# Patient Record
Sex: Male | Born: 1940 | Race: White | Hispanic: No | Marital: Married | Smoking: Former smoker
Health system: Southern US, Community
[De-identification: ages and names within clinical notes are randomized; demographics above are authoritative.]

## PROBLEM LIST (undated history)

## (undated) DIAGNOSIS — G720 Drug-induced myopathy: Secondary | ICD-10-CM

## (undated) DIAGNOSIS — H269 Unspecified cataract: Secondary | ICD-10-CM

## (undated) DIAGNOSIS — M1990 Unspecified osteoarthritis, unspecified site: Secondary | ICD-10-CM

## (undated) DIAGNOSIS — I10 Essential (primary) hypertension: Secondary | ICD-10-CM

## (undated) DIAGNOSIS — L03119 Cellulitis of unspecified part of limb: Secondary | ICD-10-CM

## (undated) DIAGNOSIS — N200 Calculus of kidney: Secondary | ICD-10-CM

## (undated) DIAGNOSIS — J189 Pneumonia, unspecified organism: Secondary | ICD-10-CM

## (undated) DIAGNOSIS — T7840XA Allergy, unspecified, initial encounter: Secondary | ICD-10-CM

## (undated) DIAGNOSIS — J302 Other seasonal allergic rhinitis: Secondary | ICD-10-CM

## (undated) DIAGNOSIS — E785 Hyperlipidemia, unspecified: Secondary | ICD-10-CM

## (undated) DIAGNOSIS — T466X5A Adverse effect of antihyperlipidemic and antiarteriosclerotic drugs, initial encounter: Secondary | ICD-10-CM

## (undated) DIAGNOSIS — B029 Zoster without complications: Secondary | ICD-10-CM

## (undated) HISTORY — DX: Unspecified cataract: H26.9

## (undated) HISTORY — DX: Calculus of kidney: N20.0

## (undated) HISTORY — DX: Allergy, unspecified, initial encounter: T78.40XA

## (undated) HISTORY — DX: Zoster without complications: B02.9

## (undated) HISTORY — DX: Hyperlipidemia, unspecified: E78.5

## (undated) HISTORY — DX: Essential (primary) hypertension: I10

## (undated) HISTORY — DX: Adverse effect of antihyperlipidemic and antiarteriosclerotic drugs, initial encounter: T46.6X5A

## (undated) HISTORY — DX: Drug-induced myopathy: G72.0

## (undated) HISTORY — DX: Pneumonia, unspecified organism: J18.9

---
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
## 2005-12-05 ENCOUNTER — Ambulatory Visit: Payer: Self-pay | Admitting: Cardiology

## 2005-12-11 ENCOUNTER — Ambulatory Visit: Payer: Self-pay

## 2008-05-26 HISTORY — PX: LITHOTRIPSY: SUR834

## 2008-05-26 HISTORY — PX: UMBILICAL HERNIA REPAIR: SUR1181

## 2009-12-31 ENCOUNTER — Emergency Department (HOSPITAL_COMMUNITY): Admission: EM | Admit: 2009-12-31 | Discharge: 2009-12-31 | Payer: Self-pay | Admitting: Emergency Medicine

## 2010-01-17 ENCOUNTER — Ambulatory Visit (HOSPITAL_COMMUNITY): Admission: RE | Admit: 2010-01-17 | Discharge: 2010-01-17 | Payer: Self-pay | Admitting: Urology

## 2010-04-09 ENCOUNTER — Encounter: Admission: RE | Admit: 2010-04-09 | Discharge: 2010-04-09 | Payer: Self-pay | Admitting: Surgery

## 2010-04-11 ENCOUNTER — Ambulatory Visit (HOSPITAL_BASED_OUTPATIENT_CLINIC_OR_DEPARTMENT_OTHER): Admission: RE | Admit: 2010-04-11 | Discharge: 2010-04-11 | Payer: Self-pay | Admitting: Surgery

## 2010-08-06 LAB — BASIC METABOLIC PANEL
GFR calc Af Amer: >60 mL/min (ref >60)
GFR calc non Af Amer: 56 mL/min — ABNORMAL LOW (ref >60)
Glucose, Bld: 96 mg/dL (ref 70–99)
Potassium: 4.8 mEq/L (ref 3.5–5.1)
Sodium: 140 mEq/L (ref 135–145)

## 2010-08-09 LAB — BASIC METABOLIC PANEL
GFR calc Af Amer: 39 mL/min — ABNORMAL LOW (ref >60)
GFR calc non Af Amer: 32 mL/min — ABNORMAL LOW (ref >60)
Glucose, Bld: 91 mg/dL (ref 70–99)
Potassium: 4.2 mEq/L (ref 3.5–5.1)
Sodium: 138 mEq/L (ref 135–145)

## 2010-08-09 LAB — CBC
HCT: 34.7 % — ABNORMAL LOW (ref 39.0–52.0)
Hemoglobin: 12 g/dL — ABNORMAL LOW (ref 13.0–17.0)
MCH: 31.4 pg (ref 26.0–34.0)
MCHC: 34.5 g/dL (ref 30.0–36.0)
MCV: 91.2 fL (ref 78.0–100.0)
Platelets: 298 K/uL (ref 150–400)
RBC: 3.81 MIL/uL — ABNORMAL LOW (ref 4.22–5.81)
RDW: 12.9 % (ref 11.5–15.5)
WBC: 7.7 K/uL (ref 4.0–10.5)

## 2010-08-09 LAB — URINALYSIS, ROUTINE W REFLEX MICROSCOPIC
Glucose, UA: NEGATIVE mg/dL
Leukocytes, UA: NEGATIVE
Specific Gravity, Urine: 1.028 (ref 1.005–1.030)
pH: 5.5 (ref 5.0–8.0)

## 2010-08-09 LAB — URINE MICROSCOPIC-ADD ON

## 2010-10-11 NOTE — Assessment & Plan Note (Signed)
[facility]                              CARDIOLOGY OFFICE NOTE   RUHAN, BORAK                    MRN:          [id number]  DATE:12/05/2005                            DOB:          [birth year]    PRIMARY CARE PHYSICIAN:  Larina Earthly, M.D.   REASON FOR PRESENTATION:  The patient had an abnormal EKG.   HISTORY OF PRESENT ILLNESS:  The patient is a very pleasant 70 year old  gentleman without prior cardiac history.  He has been managed by Dr. Felipa Eth  for hypertension, dyslipidemia.  He was recently noted to have an abnormal  EKG with T wave inversions in I, aVL, V5, and V6.  This was not noted  previously.  The patient reports no cardiac symptoms.  He does walk on a  treadmill.  He had not done that a lot recently though he did it yesterday.  He says when he does he gets his heart rate up, though he does not track it.  He works hard enough to be short of breath and sweat.  With this, he denies  any chest pressure, neck discomfort , arm discomfort, activity-induced  nausea, vomiting, excessive diaphoresis.  He does not have any palpitations,  presyncope, or syncope.  He denies any PND, or orthopnea.   PAST MEDICAL HISTORY:  1.  Hypertension times 2-3 years.  2.  Hyperlipidemia.   PAST SURGICAL HISTORY:  None.   ALLERGIES:  NONE.   MEDICATIONS:  1.  Lisinopril 20 mg every day.  2.  Coreg 40 mg every day.  3.  Allegra.   SOCIAL HISTORY:  The patient is a retired Optician, dispensing.  He has 2 children and  his grandchildren.  He quit smoking 30 years ago.  He does not drink  alcohol.   FAMILY HISTORY:  Noncontributory for early coronary artery disease.  His  father died in his 9s of a stroke.   REVIEW OF SYSTEMS:  As stated in the HPI.  Positive for snoring, hearing  loss, occasional knee pain, occasional leg cramps.  Negative for other  systems.   PHYSICAL EXAMINATION:  GENERAL:  The patient is in no distress.  VITAL SIGNS:  Weight 297  pounds, body mass index 37, blood pressure 155/82,  heart rate 65 and regular.  HEENT:  Eyes:  Unremarkable.  Pupils are equal, round, and reactive to  light.  Fundi within normal limits.  Oral mucosa unremarkable.  NECK:  No jugular venous distention at 45 degrees.  Carotid upstroke brisk  and symmetric.  No bruits.  No thyromegaly.  LYMPHATICS:  No cervical, axillary, or inguinal adenopathy.  LUNGS:  Clear to auscultation bilaterally.  BACK:  No costovertebral angle tenderness.  CHEST:  Unremarkable.  HEART:  PMI not displaced or sustained.  S1 and S2 within normal limits.  No  S3.  No S4.  No murmurs.  ABDOMEN:  Obese.  Midline umbilical hernia.  Positive bowel sounds, normal  in frequency and pitch.  No bruits.  No rebound.  No guarding.  No midline  pulsatile mass.  No hepatomegaly.  No splenomegaly.  SKIN:  No rashes.  No nodules.  EXTREMITIES:  With 2+ pulses throughout.  No edema.  No cyanosis.  No  clubbing.  NEUROLOGIC:  Oriented to person, place, and time.  Cranial nerves II-XII  grossly intact.  Motor grossly intact.   EKG:  Sinus rhythm, rate 65, axis within normal limits, intervals within  normal limits, high lateral T wave inversions 1 and aVL.  No acute ST-T wave  changes.   ASSESSMENT/PLAN:  1.  Abnormal electrocardiogram.  The patient does have an abnormal      electrocardiogram.  The T wave inversion seen previously in V5 and V6      are not present today.  This most likely is related to hypertension,      though he is at moderate risk for obstructive coronary disease.      Therefore, he is going to get a stress perfusion study.  We will also      aggressively pursue primary risk reduction.  2.  Hypertension.  Blood pressure is difficult to control.  I have taken the      liberty of changing him to Lisinopril HCT 20/12.5.  I have advised him      to get a blood pressure cuff and keep a diary for Dr. Felipa Eth.  3.  Of note, the patient has symptoms consistent with  sleep apnea with      daytime somnolence and snoring.  I will defer to Dr. Felipa Eth but would      suggest a sleep study.  I have discussed this diagnosis at length with      the patient including the treatment of CPAP and weight loss.  He will      follow up with Dr. Felipa Eth to pursue this.  he should increase his citrus      fruits and have a B-MET when he sees Dr. Felipa Eth in August.  It is unlikely      that he will get hypokalemia with this low dose of diuretic and with      dietary supplement.  4.  Obesity.  We spent quite a bit of time discussing this.  He has a body      mass index that puts him in the morbid obesity range.  He says he does      follow a healthy lifestyle changes and seems to eat foods on a [facility] Diet; therefore, I think he might best be served by calorie-      counting with a Weight Watchers approach.  5.  Dyslipidemia.  He had improvement in his lipid profile recently and this      is followed by Dr. Felipa Eth.   FOLLOWUP:  Will be back in this clinic as needed.  If he has an abnormal  stress perfusion study, I will follow up accordingly.  Otherwise, he will  keep his appointment with Dr. Felipa Eth for followup of his blood pressure and  primary risk reduction.                                   Rollene Rotunda, MD, [facility]   JH/MedQ  DD:  12/05/2005  DT:  12/05/2005  Job #:  [id number]   cc:   Larina Earthly, MD

## 2011-04-16 ENCOUNTER — Other Ambulatory Visit: Payer: Self-pay | Admitting: Orthopedic Surgery

## 2011-04-16 ENCOUNTER — Encounter: Payer: Self-pay | Admitting: Orthopedic Surgery

## 2011-04-16 ENCOUNTER — Encounter (HOSPITAL_COMMUNITY): Payer: Self-pay | Admitting: Pharmacy Technician

## 2011-04-16 NOTE — H&P (Signed)
Shawn Keller  DOB: 12/02/1940  Date of Admission:  04/28/2011  Chief Complaint:  Left Knee Pain  History of Present Illness The patient is a 70 year old male who comes in today for a preoperative History and Physical. The patient is scheduled for a left total knee arthroplasty to be performed by Dr. Frank V. Aluisio, MD at [facility] on 04/28/2011.  Allergies No Known Drug Allergies.   Medication History Lisinopril (20MG Tablet, Oral daily) Active. Carvedilol (25MG Tablet, 1/2 Oral daily) Active. Fish Oil Burp-Less (1200MG Capsule, 2 Oral daily) Active. Aspirin (325MG Tablet, Oral daily) Active. Ibuprofen (200MG Capsule, 2 Oral three times daily, as needed) Active.  Problem List/Past Medical Shingles Pneumonia. 1953, 1967 Hypertension Hypercholesterolemia. Hyperlipidemia Kidney Stone Gout Measles Mumps History of Statin Myalgias Umbilical Hernia  Past Surgical History Tonsillectomy. Date: 1946. Umbilical Heria Repair. Date: 03/2010. Dr. Streck Lithotripsy. Date: 12/2009.  Family History Father. Deceased, Cerebrovascular Accident. Age 85 Mother. Deceased, Aneurysm. Age 77  Social History Marital status. Married. Tobacco use. Former smoker. Quit 35 years ago Alcohol use. Occasional alcohol use. Children. 2 Post-Surgical Plans. Plan to go Home after surgery Advance Directives. Living Will, Healthcare POA  Review of Systems General:Not Present- Chills, Fever, Night Sweats, Fatigue, Weight Gain, Weight Loss and Memory Loss. Skin:Not Present- Hives, Itching, Rash, Eczema and Lesions. HEENT:Not Present- Tinnitus, Headache, Double Vision, Visual Loss, Hearing Loss and Dentures. Respiratory:Not Present- Shortness of breath with exertion, Shortness of breath at rest, Allergies, Coughing up blood and Chronic Cough. Cardiovascular:Not Present- Chest Pain, Racing/skipping heartbeats, Difficulty Breathing Lying Down, Murmur,  Swelling and Palpitations. Gastrointestinal:Not Present- Bloody Stool, Heartburn, Abdominal Pain, Vomiting, Nausea, Constipation, Diarrhea, Difficulty Swallowing, Jaundice and Loss of appetitie. Male Genitourinary:Present- Urinary frequency and Weak urinary stream. Not Present- Blood in Urine, Discharge, Flank Pain, Incontinence, Painful Urination, Urgency, Urinary Retention and Urinating at Night. Musculoskeletal:Present- Joint Swelling, Joint Pain and Morning Stiffness. Not Present- Muscle Weakness, Muscle Pain, Back Pain and Spasms. Neurological:Not Present- Tremor, Dizziness, Blackout spells, Paralysis, Difficulty with balance and Weakness. Psychiatric:Not Present- Insomnia.  Vitals 04/15/2011 4:01 PM Weight: 250 lb Height: 72 in Body Surface Area: 2.4 m Body Mass Index: 33.91 kg/m Pulse: 60 (Regular) Resp.: 14 (Unlabored) BP: 152/88 (Sitting, Right Arm, Standard)  Physical Exam The physical exam findings are as follows: Note: Patient is a 70 year old male with continued knee pain.  General Mental Status - Alert, cooperative and good historian. General Appearance- pleasant. Not in acute distress. Orientation- Oriented X3. Build & Nutrition- Well nourished and Well developed.  Head and Neck Head- normocephalic, atraumatic . Neck Global Assessment- supple. no bruit auscultated on the right and no bruit auscultated on the left.  Eye Pupil- Bilateral- Regular and Round. Note: Patient wears glasses Motion- Bilateral- EOMI. EarsNote: Patient wears bilateral hearing aids.  Chest and Lung Exam Auscultation: Breath sounds:- clear at anterior chest wall and - clear at posterior chest wall. Adventitious sounds:- No Adventitious sounds.  Cardiovascular Auscultation:Rhythm- Regular rate and rhythm. Heart Sounds- S1 WNL and S2 WNL. Murmurs & Other Heart Sounds:Auscultation of the heart reveals - No  Murmurs.  Abdomen Palpation/Percussion:Tenderness- Abdomen is non-tender to palpation. Rigidity (guarding)- Abdomen is soft. Auscultation:Auscultation of the abdomen reveals - Bowel sounds normal.  Male Genitourinary Note: Not done, not pertinent to present illness  Musculoskeletal Note: Evaluation of his hip shows a normal range of motion with no discomfort. His knees show a slight varus deformity on both sides. He does not have an effusion in either   knee. He is tender along the medial aspect of both knees.  Range of motion on the right knee is about 5-120 and left knee about 5-125. There is no instability about either knee. Pulse, sensation and motor are intact distally. He walks with an antalgic gait pattern.  RADIOGRAPHS AP both knees and lateral show that he has advanced arthritis in both knees. Actually a little bit worse on the left than right. He is bone on bone in the medial and patellofemoral compartments of both knees.  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 Aluisio. Risks and benefits of the surgery have been discussed with the patient and they elect to proceed with surgery.  There are on active contraindications to upcoming procedure such as ongoing infection or progressive neurological disease.  Drew Perkins, PA-C     

## 2011-04-24 ENCOUNTER — Encounter (HOSPITAL_COMMUNITY): Payer: Self-pay

## 2011-04-24 ENCOUNTER — Encounter (HOSPITAL_COMMUNITY)
Admission: RE | Admit: 2011-04-24 | Discharge: 2011-04-24 | Disposition: A | Discharge status: Home / Self Care | Payer: Medicare Other | Source: Ambulatory Visit | Attending: Orthopedic Surgery | Admitting: Orthopedic Surgery

## 2011-04-24 HISTORY — DX: Other seasonal allergic rhinitis: J30.2

## 2011-04-24 HISTORY — DX: Cellulitis of unspecified part of limb: L03.119

## 2011-04-24 HISTORY — DX: Unspecified osteoarthritis, unspecified site: M19.90

## 2011-04-24 LAB — CBC
MCH: 30.6 pg (ref 26.0–34.0)
MCHC: 33.9 g/dL (ref 30.0–36.0)
RDW: 13.5 % (ref 11.5–15.5)

## 2011-04-24 LAB — COMPREHENSIVE METABOLIC PANEL
ALT: 15 U/L (ref 0–53)
Albumin: 3.9 g/dL (ref 3.5–5.2)
Alkaline Phosphatase: 70 U/L (ref 39–117)
Calcium: 9.4 mg/dL (ref 8.4–10.5)
Potassium: 4.2 mEq/L (ref 3.5–5.1)
Sodium: 136 mEq/L (ref 135–145)
Total Protein: 7.8 g/dL (ref 6.0–8.3)

## 2011-04-24 LAB — URINALYSIS, ROUTINE W REFLEX MICROSCOPIC
Glucose, UA: NEGATIVE mg/dL
Hgb urine dipstick: NEGATIVE
Leukocytes, UA: NEGATIVE
Specific Gravity, Urine: 1.017 (ref 1.005–1.030)
Urobilinogen, UA: 0.2 mg/dL (ref 0.0–1.0)

## 2011-04-24 LAB — SURGICAL PCR SCREEN
MRSA, PCR: NEGATIVE
Staphylococcus aureus: NEGATIVE

## 2011-04-24 LAB — PROTIME-INR: Prothrombin Time: 13.6 seconds (ref 11.6–15.2)

## 2011-04-24 LAB — APTT: aPTT: 32 seconds (ref 24–37)

## 2011-04-24 MED ORDER — CHLORHEXIDINE GLUCONATE 4 % EX LIQD: 60.0000 mL | Freq: Once | CUTANEOUS | Status: DC

## 2011-04-24 NOTE — Pre-Procedure Instructions (Signed)
States STRESS TEST DONE 7 YEARS AGO and denies problems

## 2011-04-24 NOTE — Pre-Procedure Instructions (Signed)
EKG AND CHEST X RAY REPORT FROM 9/12 on chart

## 2011-04-24 NOTE — Patient Instructions (Signed)
[address] [name]  04/24/2011   Your procedure is scheduled on:  Monday  Apr 28 2011  Surgery [id number]  Report to [facility] at 0615 AM.  Call this number if you have problems the morning of surgery: [phone]     [name]  [id number]  Remember:   Do not eat food:After Midnight.   Sunday night  May have clear liquids:until Midnight . Sunday night  Clear liquids include soda, tea, black coffee, apple or grape juice, broth.  Take these medicines the morning of surgery with A SIP OF WATER: Coreg with sip water, Tylenol if needed with sip water   Do not wear jewelry, make-up or nail polish.  Do not wear lotions, powders, or perfumes. You may wear deodorant.  Do not shave 48 hours prior to surgery.  Do not bring valuables to the hospital.  Contacts, dentures or bridgework may not be worn into surgery.  Leave suitcase in the car. After surgery it may be brought to your room.  For patients admitted to the hospital, checkout time is 11:00 AM the day of discharge.   Patients discharged the day of surgery will not be allowed to drive home.  Name and phone number of your driver: water  Special Instructions: CHG Shower Use Special Wash: 1/2 bottle night before surgery and 1/2 bottle morning of surgery.  REgular soap face and privates   Please read over the following fact sheets that you were given: MRSA Information

## 2011-04-27 MED ORDER — BUPIVACAINE 0.25 % ON-Q PUMP SINGLE CATH 300ML
300.0000 mL | INJECTION | Status: DC
  Filled 2011-04-27: qty 300

## 2011-04-28 ENCOUNTER — Encounter (HOSPITAL_COMMUNITY): Payer: Self-pay | Admitting: Orthopedic Surgery

## 2011-04-28 ENCOUNTER — Encounter (HOSPITAL_COMMUNITY): Payer: Self-pay | Admitting: Anesthesiology

## 2011-04-28 ENCOUNTER — Encounter (HOSPITAL_COMMUNITY): Payer: Self-pay | Admitting: *Deleted

## 2011-04-28 ENCOUNTER — Inpatient Hospital Stay (HOSPITAL_COMMUNITY)
Admission: RE | Admit: 2011-04-28 | Discharge: 2011-04-30 | DRG: 470 | Disposition: A | Discharge status: Home Health | Payer: Medicare Other | Source: Ambulatory Visit | Attending: Orthopedic Surgery | Admitting: Orthopedic Surgery

## 2011-04-28 ENCOUNTER — Inpatient Hospital Stay (HOSPITAL_COMMUNITY): Payer: Medicare Other | Admitting: Anesthesiology

## 2011-04-28 ENCOUNTER — Encounter (HOSPITAL_COMMUNITY)
Admission: RE | Disposition: A | Discharge status: Home Health | Payer: Self-pay | Source: Ambulatory Visit | Attending: Orthopedic Surgery

## 2011-04-28 DIAGNOSIS — Z01812 Encounter for preprocedural laboratory examination: Secondary | ICD-10-CM

## 2011-04-28 DIAGNOSIS — I10 Essential (primary) hypertension: Secondary | ICD-10-CM | POA: Diagnosis present

## 2011-04-28 DIAGNOSIS — M1710 Unilateral primary osteoarthritis, unspecified knee: Principal | ICD-10-CM | POA: Diagnosis present

## 2011-04-28 DIAGNOSIS — M1712 Unilateral primary osteoarthritis, left knee: Secondary | ICD-10-CM | POA: Diagnosis present

## 2011-04-28 HISTORY — PX: TOTAL KNEE ARTHROPLASTY: SHX125

## 2011-04-28 LAB — TYPE AND SCREEN

## 2011-04-28 SURGERY — ARTHROPLASTY, KNEE, TOTAL
Anesthesia: Choice | Site: Knee | Laterality: Left

## 2011-04-28 MED ORDER — LACTATED RINGERS IV SOLN
INTRAVENOUS | Status: DC
  Administered 2011-04-28 (×2): via INTRAVENOUS
  Administered 2011-04-28: 1000 mL via INTRAVENOUS
  Administered 2011-04-28: 08:00:00 via INTRAVENOUS

## 2011-04-28 MED ORDER — CEFAZOLIN SODIUM 1-5 GM-% IV SOLN
1.0000 g | Freq: Four times a day (QID) | INTRAVENOUS | Status: AC
  Administered 2011-04-28 – 2011-04-29 (×3): 1 g via INTRAVENOUS
  Filled 2011-04-28 (×3): qty 50

## 2011-04-28 MED ORDER — MIDAZOLAM HCL 5 MG/5ML IJ SOLN
INTRAMUSCULAR | Status: DC | PRN
  Administered 2011-04-28: 2 mg via INTRAVENOUS

## 2011-04-28 MED ORDER — SODIUM CHLORIDE 0.9 % IR SOLN
Status: DC | PRN
  Administered 2011-04-28: 1000 mL

## 2011-04-28 MED ORDER — PROPOFOL 10 MG/ML IV EMUL
INTRAVENOUS | Status: DC | PRN
  Administered 2011-04-28: 160 ug/kg/min via INTRAVENOUS

## 2011-04-28 MED ORDER — TEMAZEPAM 15 MG PO CAPS: 15.0000 mg | ORAL_CAPSULE | Freq: Every evening | ORAL | Status: DC | PRN

## 2011-04-28 MED ORDER — RIVAROXABAN 10 MG PO TABS
10.0000 mg | ORAL_TABLET | Freq: Every day | ORAL | Status: DC
  Administered 2011-04-29 – 2011-04-30 (×2): 10 mg via ORAL
  Filled 2011-04-28 (×3): qty 1

## 2011-04-28 MED ORDER — DOCUSATE SODIUM 100 MG PO CAPS
100.0000 mg | ORAL_CAPSULE | Freq: Two times a day (BID) | ORAL | Status: DC
  Administered 2011-04-28 – 2011-04-30 (×4): 100 mg via ORAL
  Filled 2011-04-28 (×8): qty 1

## 2011-04-28 MED ORDER — ACETAMINOPHEN 10 MG/ML IV SOLN
1000.0000 mg | Freq: Four times a day (QID) | INTRAVENOUS | Status: AC
  Administered 2011-04-28 – 2011-04-29 (×4): 1000 mg via INTRAVENOUS
  Filled 2011-04-28 (×4): qty 100

## 2011-04-28 MED ORDER — OXYCODONE HCL 5 MG PO TABS
5.0000 mg | ORAL_TABLET | ORAL | Status: DC | PRN
  Administered 2011-04-29: 5 mg via ORAL
  Administered 2011-04-29: 10 mg via ORAL
  Administered 2011-04-29: 5 mg via ORAL
  Administered 2011-04-29 – 2011-04-30 (×6): 10 mg via ORAL
  Filled 2011-04-28 (×4): qty 2
  Filled 2011-04-28: qty 1
  Filled 2011-04-28: qty 2
  Filled 2011-04-28: qty 1
  Filled 2011-04-28 (×2): qty 2

## 2011-04-28 MED ORDER — SODIUM CHLORIDE 0.9 % IJ SOLN: 9.0000 mL | INTRAMUSCULAR | Status: DC | PRN

## 2011-04-28 MED ORDER — METOCLOPRAMIDE HCL 10 MG PO TABS: 5.0000 mg | ORAL_TABLET | Freq: Three times a day (TID) | ORAL | Status: DC | PRN

## 2011-04-28 MED ORDER — DEXTROSE-NACL 5-0.9 % IV SOLN
INTRAVENOUS | Status: DC
  Administered 2011-04-28 – 2011-04-30 (×2): via INTRAVENOUS

## 2011-04-28 MED ORDER — PROMETHAZINE HCL 25 MG/ML IJ SOLN: 6.2500 mg | INTRAMUSCULAR | Status: DC | PRN

## 2011-04-28 MED ORDER — FENTANYL CITRATE 0.05 MG/ML IJ SOLN
INTRAMUSCULAR | Status: DC | PRN
  Administered 2011-04-28: 50 ug via INTRAVENOUS

## 2011-04-28 MED ORDER — PHENOL 1.4 % MT LIQD: 1.0000 | OROMUCOSAL | Status: DC | PRN

## 2011-04-28 MED ORDER — CEFAZOLIN SODIUM-DEXTROSE 2-3 GM-% IV SOLR
2.0000 g | Freq: Once | INTRAVENOUS | Status: AC
  Administered 2011-04-28: 2 g via INTRAVENOUS

## 2011-04-28 MED ORDER — METOCLOPRAMIDE HCL 5 MG/ML IJ SOLN: 5.0000 mg | Freq: Three times a day (TID) | INTRAMUSCULAR | Status: DC | PRN

## 2011-04-28 MED ORDER — METHOCARBAMOL 500 MG PO TABS
500.0000 mg | ORAL_TABLET | Freq: Four times a day (QID) | ORAL | Status: DC | PRN
  Administered 2011-04-29 – 2011-04-30 (×4): 500 mg via ORAL
  Filled 2011-04-28 (×4): qty 1

## 2011-04-28 MED ORDER — HYDROMORPHONE HCL PF 1 MG/ML IJ SOLN: 0.2500 mg | INTRAMUSCULAR | Status: DC | PRN

## 2011-04-28 MED ORDER — MENTHOL 3 MG MT LOZG: 1.0000 | LOZENGE | OROMUCOSAL | Status: DC | PRN

## 2011-04-28 MED ORDER — DIPHENHYDRAMINE HCL 50 MG/ML IJ SOLN: 12.5000 mg | Freq: Four times a day (QID) | INTRAMUSCULAR | Status: DC | PRN

## 2011-04-28 MED ORDER — CHLORHEXIDINE GLUCONATE 4 % EX LIQD: 60.0000 mL | Freq: Once | CUTANEOUS | Status: DC

## 2011-04-28 MED ORDER — ONDANSETRON HCL 4 MG PO TABS: 4.0000 mg | ORAL_TABLET | Freq: Four times a day (QID) | ORAL | Status: DC | PRN

## 2011-04-28 MED ORDER — ONDANSETRON HCL 4 MG/2ML IJ SOLN: 4.0000 mg | Freq: Four times a day (QID) | INTRAMUSCULAR | Status: DC | PRN

## 2011-04-28 MED ORDER — DEXTROSE 5 % IV SOLN
500.0000 mg | Freq: Four times a day (QID) | INTRAVENOUS | Status: DC | PRN
  Administered 2011-04-28 – 2011-04-29 (×3): 500 mg via INTRAVENOUS
  Filled 2011-04-28 (×4): qty 5

## 2011-04-28 MED ORDER — BUPIVACAINE 0.25 % ON-Q PUMP SINGLE CATH 300ML
INJECTION | Status: DC | PRN
  Administered 2011-04-28: 300 mL

## 2011-04-28 MED ORDER — NALOXONE HCL 0.4 MG/ML IJ SOLN: 0.4000 mg | INTRAMUSCULAR | Status: DC | PRN

## 2011-04-28 MED ORDER — DIPHENHYDRAMINE HCL 12.5 MG/5ML PO ELIX: 12.5000 mg | ORAL_SOLUTION | Freq: Four times a day (QID) | ORAL | Status: DC | PRN

## 2011-04-28 MED ORDER — MEPERIDINE HCL 50 MG/ML IJ SOLN: 6.2500 mg | INTRAMUSCULAR | Status: DC | PRN

## 2011-04-28 MED ORDER — MORPHINE SULFATE (PF) 1 MG/ML IV SOLN
INTRAVENOUS | Status: DC
  Administered 2011-04-28: 11:00:00 via INTRAVENOUS
  Administered 2011-04-28: 5 mg via INTRAVENOUS
  Administered 2011-04-28: 7 mg via INTRAVENOUS
  Administered 2011-04-28: 7.99 mg via INTRAVENOUS
  Administered 2011-04-28: 22:00:00 via INTRAVENOUS
  Administered 2011-04-29: 5 mg via INTRAVENOUS
  Filled 2011-04-28 (×2): qty 25

## 2011-04-28 MED ORDER — BUPIVACAINE ON-Q PAIN PUMP (FOR ORDER SET NO CHG)
INJECTION | Status: DC
  Filled 2011-04-28: qty 1

## 2011-04-28 MED ORDER — CARVEDILOL 12.5 MG PO TABS
12.5000 mg | ORAL_TABLET | Freq: Two times a day (BID) | ORAL | Status: DC
  Administered 2011-04-28 – 2011-04-30 (×4): 12.5 mg via ORAL
  Filled 2011-04-28 (×8): qty 1

## 2011-04-28 MED ORDER — PROPOFOL 10 MG/ML IV BOLUS
INTRAVENOUS | Status: DC | PRN
  Administered 2011-04-28: 30 mg via INTRAVENOUS

## 2011-04-28 MED ORDER — ACETAMINOPHEN 325 MG PO TABS
650.0000 mg | ORAL_TABLET | Freq: Four times a day (QID) | ORAL | Status: DC | PRN
  Administered 2011-04-29: 650 mg via ORAL
  Filled 2011-04-28: qty 2

## 2011-04-28 MED ORDER — ACETAMINOPHEN 650 MG RE SUPP: 650.0000 mg | Freq: Four times a day (QID) | RECTAL | Status: DC | PRN

## 2011-04-28 MED ORDER — DIPHENHYDRAMINE HCL 12.5 MG/5ML PO ELIX: 12.5000 mg | ORAL_SOLUTION | ORAL | Status: DC | PRN

## 2011-04-28 MED ORDER — LACTATED RINGERS IV SOLN
INTRAVENOUS | Status: DC
  Administered 2011-04-28: 12:00:00 via INTRAVENOUS

## 2011-04-28 SURGICAL SUPPLY — 56 items
BAG SPEC THK2 15X12 ZIP CLS (MISCELLANEOUS) ×1
BAG ZIPLOCK 12X15 (MISCELLANEOUS) ×2 IMPLANT
BANDAGE ELASTIC 6 VELCRO ST LF (GAUZE/BANDAGES/DRESSINGS) ×2 IMPLANT
BANDAGE ESMARK 6X9 LF (GAUZE/BANDAGES/DRESSINGS) ×1 IMPLANT
BLADE SAG 18X100X1.27 (BLADE) ×2 IMPLANT
BLADE SAW SGTL 11.0X1.19X90.0M (BLADE) ×2 IMPLANT
BNDG CMPR 9X6 STRL LF SNTH (GAUZE/BANDAGES/DRESSINGS) ×1
BNDG ESMARK 6X9 LF (GAUZE/BANDAGES/DRESSINGS) ×2
BOWL SMART MIX CTS (DISPOSABLE) ×2 IMPLANT
CATH KIT ON-Q SILVERSOAK 5 (CATHETERS) ×1 IMPLANT
CATH KIT ON-Q SILVERSOAK 5IN (CATHETERS) ×2 IMPLANT
CEMENT HV SMART SET (Cement) ×4 IMPLANT
CLOSURE STERI STRIP 1/2 X4 (GAUZE/BANDAGES/DRESSINGS) ×2 IMPLANT
CLOTH BEACON ORANGE TIMEOUT ST (SAFETY) ×2 IMPLANT
CUFF TOURN SGL QUICK 34 (TOURNIQUET CUFF) ×2
CUFF TRNQT CYL 34X4X40X1 (TOURNIQUET CUFF) ×1 IMPLANT
DRAPE EXTREMITY T 121X128X90 (DRAPE) ×2 IMPLANT
DRAPE POUCH INSTRU U-SHP 10X18 (DRAPES) ×2 IMPLANT
DRAPE U-SHAPE 47X51 STRL (DRAPES) ×2 IMPLANT
DRSG ADAPTIC 3X8 NADH LF (GAUZE/BANDAGES/DRESSINGS) ×2 IMPLANT
DRSG PAD ABDOMINAL 8X10 ST (GAUZE/BANDAGES/DRESSINGS) ×2 IMPLANT
DURAPREP 26ML APPLICATOR (WOUND CARE) ×2 IMPLANT
ELECT REM PT RETURN 9FT ADLT (ELECTROSURGICAL) ×2
ELECTRODE REM PT RTRN 9FT ADLT (ELECTROSURGICAL) ×1 IMPLANT
EVACUATOR 1/8 PVC DRAIN (DRAIN) ×2 IMPLANT
FACESHIELD LNG OPTICON STERILE (SAFETY) ×10 IMPLANT
GLOVE BIO SURGEON STRL SZ7.5 (GLOVE) ×2 IMPLANT
GLOVE BIO SURGEON STRL SZ8 (GLOVE) ×2 IMPLANT
GLOVE BIOGEL PI IND STRL 8 (GLOVE) ×2 IMPLANT
GLOVE BIOGEL PI INDICATOR 8 (GLOVE) ×2
GOWN STRL NON-REIN LRG LVL3 (GOWN DISPOSABLE) ×2 IMPLANT
GOWN STRL REIN XL XLG (GOWN DISPOSABLE) ×2 IMPLANT
HANDPIECE INTERPULSE COAX TIP (DISPOSABLE) ×2
IMMOBILIZER KNEE 20 (SOFTGOODS) ×2
IMMOBILIZER KNEE 20 THIGH 36 (SOFTGOODS) ×1 IMPLANT
KIT BASIN OR (CUSTOM PROCEDURE TRAY) ×2 IMPLANT
MANIFOLD NEPTUNE II (INSTRUMENTS) ×2 IMPLANT
NS IRRIG 1000ML POUR BTL (IV SOLUTION) ×2 IMPLANT
PACK TOTAL JOINT (CUSTOM PROCEDURE TRAY) ×2 IMPLANT
PAD ABD 7.5X8 STRL (GAUZE/BANDAGES/DRESSINGS) ×2 IMPLANT
PADDING CAST COTTON 6X4 STRL (CAST SUPPLIES) ×6 IMPLANT
PADDING WEBRIL 6 STERILE (GAUZE/BANDAGES/DRESSINGS) ×1 IMPLANT
POSITIONER SURGICAL ARM (MISCELLANEOUS) ×2 IMPLANT
SET HNDPC FAN SPRY TIP SCT (DISPOSABLE) ×1 IMPLANT
SPONGE GAUZE 4X4 12PLY (GAUZE/BANDAGES/DRESSINGS) ×2 IMPLANT
SPONGE GAUZE 4X4 FOR O.R. (GAUZE/BANDAGES/DRESSINGS) ×1 IMPLANT
STRIP CLOSURE SKIN 1/2X4 (GAUZE/BANDAGES/DRESSINGS) ×4 IMPLANT
SUCTION FRAZIER 12FR DISP (SUCTIONS) ×2 IMPLANT
SUT MNCRL AB 4-0 PS2 18 (SUTURE) ×2 IMPLANT
SUT PDS AB 1 CT1 27 (SUTURE) ×6 IMPLANT
SUT VIC AB 2-0 CT1 27 (SUTURE) ×6
SUT VIC AB 2-0 CT1 TAPERPNT 27 (SUTURE) ×3 IMPLANT
TOWEL OR 17X26 10 PK STRL BLUE (TOWEL DISPOSABLE) ×4 IMPLANT
TRAY FOLEY CATH 14FRSI W/METER (CATHETERS) ×2 IMPLANT
WATER STERILE IRR 1500ML POUR (IV SOLUTION) ×2 IMPLANT
WRAP KNEE MAXI GEL POST OP (GAUZE/BANDAGES/DRESSINGS) ×4 IMPLANT

## 2011-04-28 NOTE — Anesthesia Procedure Notes (Signed)
Spinal  Patient location during procedure: OR Staffing Anesthesiologist: Phillips Grout Performed by: anesthesiologist  Preanesthetic Checklist Completed: patient identified, site marked, surgical consent, pre-op evaluation, timeout performed, IV checked, risks and benefits discussed and monitors and equipment checked Spinal Block Patient position: sitting Prep: Betadine Patient monitoring: heart rate, continuous pulse ox and blood pressure Location: L3-4 Injection technique: single-shot Needle Needle type: Spinocan  Needle gauge: 22 G Needle length: 9 cm Assessment Sensory level: T10 Additional Notes Expiration date of kit checked and confirmed. Patient tolerated procedure well, without complications.

## 2011-04-28 NOTE — Plan of Care (Signed)
Problem: Consults Goal: Diagnosis- Total Joint Replacement Left total knee     

## 2011-04-28 NOTE — Anesthesia Preprocedure Evaluation (Addendum)
Anesthesia Evaluation  Patient identified by MRN, date of birth, ID band Patient awake    Reviewed: Allergy & Precautions, H&P , NPO status , Patient's Chart, lab work & pertinent test results  Airway Mallampati: II TM Distance: >3 FB Neck ROM: Full    Dental No notable dental hx.    Pulmonary neg pulmonary ROS,  clear to auscultation  Pulmonary exam normal       Cardiovascular hypertension, Pt. on medications neg cardio ROS Regular Normal    Neuro/Psych Negative Neurological ROS  Negative Psych ROS   GI/Hepatic negative GI ROS, Neg liver ROS,   Endo/Other  Negative Endocrine ROS  Renal/GU negative Renal ROS  Genitourinary negative   Musculoskeletal negative musculoskeletal ROS (+)   Abdominal   Peds negative pediatric ROS (+)  Hematology negative hematology ROS (+)   Anesthesia Other Findings   Reproductive/Obstetrics negative OB ROS                          Anesthesia Physical Anesthesia Plan  ASA: III  Anesthesia Plan: Spinal   Post-op Pain Management:    Induction: Intravenous  Airway Management Planned:   Additional Equipment:   Intra-op Plan:   Post-operative Plan: Extubation in OR  Informed Consent: I have reviewed the patients History and Physical, chart, labs and discussed the procedure including the risks, benefits and alternatives for the proposed anesthesia with the patient or authorized representative who has indicated his/her understanding and acceptance.   Dental advisory given  Plan Discussed with: CRNA  Anesthesia Plan Comments:         Anesthesia Quick Evaluation

## 2011-04-28 NOTE — H&P (View-Only) (Signed)
Shawn Keller  DOB: 02-May-1941  Date of Admission:  04/28/2011  Chief Complaint:  Left Knee Pain  History of Present Illness The patient is a 70 year old male who comes in today for a preoperative History and Physical. The patient is scheduled for a left total knee arthroplasty to be performed by Dr. Gus Rankin. Aluisio, MD at [facility] on 04/28/2011.  Allergies No Known Drug Allergies.   Medication History Lisinopril (20MG  Tablet, Oral daily) Active. Carvedilol (25MG  Tablet, 1/2 Oral daily) Active. Fish Oil Burp-Less (1200MG  Capsule, 2 Oral daily) Active. Aspirin (325MG  Tablet, Oral daily) Active. Ibuprofen (200MG  Capsule, 2 Oral three times daily, as needed) Active.  Problem List/Past Medical Shingles Pneumonia. 1953, 1967 Hypertension Hypercholesterolemia. Hyperlipidemia Kidney Stone Gout Measles Mumps History of Statin Myalgias Umbilical Hernia  Past Surgical History Tonsillectomy. Date: 1946. Umbilical Heria Repair. Date: 03/2010. Dr. Jamey Ripa Lithotripsy. Date: 12/2009.  Family History Father. Deceased, Cerebrovascular Accident. Age 75 Mother. Deceased, Aneurysm. Age 34  Social History Marital status. Married. Tobacco use. Former smoker. Quit 35 years ago Alcohol use. Occasional alcohol use. Children. 2 Post-Surgical Plans. Plan to go Home after surgery Advance Directives. Living Will, Healthcare POA  Review of Systems General:Not Present- Chills, Fever, Night Sweats, Fatigue, Weight Gain, Weight Loss and Memory Loss. Skin:Not Present- Hives, Itching, Rash, Eczema and Lesions. HEENT:Not Present- Tinnitus, Headache, Double Vision, Visual Loss, Hearing Loss and Dentures. Respiratory:Not Present- Shortness of breath with exertion, Shortness of breath at rest, Allergies, Coughing up blood and Chronic Cough. Cardiovascular:Not Present- Chest Pain, Racing/skipping heartbeats, Difficulty Breathing Lying Down, Murmur,  Swelling and Palpitations. Gastrointestinal:Not Present- Bloody Stool, Heartburn, Abdominal Pain, Vomiting, Nausea, Constipation, Diarrhea, Difficulty Swallowing, Jaundice and Loss of appetitie. Male Genitourinary:Present- Urinary frequency and Weak urinary stream. Not Present- Blood in Urine, Discharge, Flank Pain, Incontinence, Painful Urination, Urgency, Urinary Retention and Urinating at Night. Musculoskeletal:Present- Joint Swelling, Joint Pain and Morning Stiffness. Not Present- Muscle Weakness, Muscle Pain, Back Pain and Spasms. Neurological:Not Present- Tremor, Dizziness, Blackout spells, Paralysis, Difficulty with balance and Weakness. Psychiatric:Not Present- Insomnia.  Vitals 04/15/2011 4:01 PM Weight: 250 lb Height: 72 in Body Surface Area: 2.4 m Body Mass Index: 33.91 kg/m Pulse: 60 (Regular) Resp.: 14 (Unlabored) BP: 152/88 (Sitting, Right Arm, Standard)  Physical Exam The physical exam findings are as follows: Note: Patient is a 70 year old male with continued knee pain.  General Mental Status - Alert, cooperative and good historian. General Appearance- pleasant. Not in acute distress. Orientation- Oriented X3. Build & Nutrition- Well nourished and Well developed.  Head and Neck Head- normocephalic, atraumatic . Neck Global Assessment- supple. no bruit auscultated on the right and no bruit auscultated on the left.  Eye Pupil- Bilateral- Regular and Round. Note: Patient wears glasses Motion- Bilateral- EOMI. EarsNote: Patient wears bilateral hearing aids.  Chest and Lung Exam Auscultation: Breath sounds:- clear at anterior chest wall and - clear at posterior chest wall. Adventitious sounds:- No Adventitious sounds.  Cardiovascular Auscultation:Rhythm- Regular rate and rhythm. Heart Sounds- S1 WNL and S2 WNL. Murmurs & Other Heart Sounds:Auscultation of the heart reveals - No  Murmurs.  Abdomen Palpation/Percussion:Tenderness- Abdomen is non-tender to palpation. Rigidity (guarding)- Abdomen is soft. Auscultation:Auscultation of the abdomen reveals - Bowel sounds normal.  Male Genitourinary Note: Not done, not pertinent to present illness  Musculoskeletal Note: Evaluation of his hip shows a normal range of motion with no discomfort. His knees show a slight varus deformity on both sides. He does not have an effusion in either  knee. He is tender along the medial aspect of both knees.  Range of motion on the right knee is about 5-120 and left knee about 5-125. There is no instability about either knee. Pulse, sensation and motor are intact distally. He walks with an antalgic gait pattern.  RADIOGRAPHS AP both knees and lateral show that he has advanced arthritis in both knees. Actually a little bit worse on the left than right. He is bone on bone in the medial and patellofemoral compartments of both knees.  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 Lequita Halt. Risks and benefits of the surgery have been discussed with the patient and they elect to proceed with surgery.  There are on active contraindications to upcoming procedure such as ongoing infection or progressive neurological disease.  Avel Peace, PA-C

## 2011-04-28 NOTE — Interval H&P Note (Signed)
History and Physical Interval Note:  04/28/2011 8:25 AM  Shawn Keller  has presented today for surgery, with the diagnosis of Osteoarthritis Left Knee  The various methods of treatment have been discussed with the patient and family. After consideration of risks, benefits and other options for treatment, the patient has consented to  Procedure(s): TOTAL KNEE ARTHROPLASTY as a surgical intervention .  The patients' history has been reviewed, patient examined, no change in status, stable for surgery.  I have reviewed the patients' chart and labs.  Questions were answered to the patient's satisfaction.     Loanne Drilling

## 2011-04-28 NOTE — Op Note (Signed)
Pre-operative diagnosis- Osteoarthritis  Left knee(s)  Post-operative diagnosis- Osteoarthritis Left knee(s)  Procedure- Left Total Knee Arthroplasty  Surgeon- Gus Rankin. Ahnya Akre, MD  Assistant- Avel Peace, PA-C   Anesthesia-  Spinal EBL-* No blood loss amount entered *  Drains Hemovac  Tourniquet time-  Total Tourniquet Time Documented: Thigh (Left) - 44 minutes   Complications- None  Condition-PACU - hemodynamically stable.   Brief Clinical Note   Shawn Keller is a 70 y.o. year old male with end stage OA of his left knee with progressively worsening pain and dysfunction. He has constant pain, with activity and at rest and significant functional deficits with difficulties even with ADLs. He has had extensive non-op management including analgesics, injections of cortisone and viscosupplements, and home exercise program, but remains in significant pain with significant dysfunction. Radiographs show bone on bone arthritis all 3 compartments with large osteophyte formation. He presents now for left Total Knee Arthroplasty.    Procedure in detail---   The patient is brought into the operating room and positioned supine on the operating table. After successful administration of  Spinal,   a tourniquet is placed high on the  Left thigh(s) and the lower extremity is prepped and draped in the usual sterile fashion. Time out is performed by the operating team and then the  Left lower extremity is wrapped in Esmarch, knee flexed and the tourniquet inflated to 300 mmHg.       A midline incision is made with a ten blade through the subcutaneous tissue to the level of the extensor mechanism. A fresh blade is used to make a medial parapatellar arthrotomy. Soft tissue over the proximal medial tibia is subperiosteally elevated to the joint line with a knife and into the semimembranosus bursa with a Cobb elevator. Soft tissue over the proximal lateral tibia is elevated with attention being paid to  avoiding the patellar tendon on the tibial tubercle. The patella is everted, knee flexed 90 degrees and the ACL and PCL are removed. Findings are bone on bone all 3 compartments with large osteophyte formation all 3 compartments.        The drill is used to create a starting hole in the distal femur and the canal is thoroughly irrigated with sterile saline to remove the fatty contents. The 5 degree Left  valgus alignment guide is placed into the femoral canal and the distal femoral cutting block is pinned to remove 11 mm off the distal femur. Resection is made with an oscillating saw.      The tibia is subluxed forward and the menisci are removed. The extramedullary alignment guide is placed referencing proximally at the medial aspect of the tibial tubercle and distally along the second metatarsal axis and tibial crest. The block is pinned to remove 2mm off the more deficient medial  side. Resection is made with an oscillating saw. Size 5is the most appropriate size for the tibia and the proximal tibia is prepared with the modular drill and keel punch for that size.      The femoral sizing guide is placed and size 5 is most appropriate. Rotation is marked off the epicondylar axis and confirmed by creating a rectangular flexion gap at 90 degrees. The size 5 cutting block is pinned in this rotation and the anterior, posterior and chamfer cuts are made with the oscillating saw. The intercondylar block is then placed and that cut is made.      Trial size 5 tibial component, trial size 5 posterior  stabilized femur and a 12.5  mm posterior stabilized rotating platform insert trial is placed. Full extension is achieved with excellent varus/valgus and anterior/posterior balance throughout full range of motion. The patella is everted and thickness measured to be 27  mm. Free hand resection is taken to 15 mm, a 41 template is placed, lug holes are drilled, trial patella is placed, and it tracks normally. Osteophytes are  removed off the posterior femur with the trial in place. All trials are removed and the cut bone surfaces prepared with pulsatile lavage. Cement is mixed and once ready for implantation, the size 5 tibial implant, size  5 posterior stabilized femoral component, and the size 41 patella are cemented in place and the patella is held with the clamp. The trial insert is placed and the knee held in full extension. All extruded cement is removed and once the cement is hard the permanent 12.5 mm posterior stabilized rotating platform insert is placed into the tibial tray.      The wound is copiously irrigated with saline solution and the extensor mechanism closed over a hemovac drain with #1 PDS suture. The tourniquet is released for a total tourniquet time of 44  minutes. Flexion against gravity is 135 degrees and the patella tracks normally. Subcutaneous tissue is closed with 2.0 vicryl and subcuticular with running 4.0 Monocryl. The catheter for the Marcaine pain pump is placed and the pump is initiated. The incision is cleaned and dried and steri-strips and a bulky sterile dressing are applied. The limb is placed into a knee immobilizer and the patient is awakened and transported to recovery in stable condition.      Please note that a surgical assistant was a medical necessity for this procedure in order to perform it in a safe and expeditious manner. Surgical assistant was necessary to retract the ligaments and vital neurovascular structures to prevent injury to them and also necessary for proper positioning of the limb to allow for anatomic placement of the prosthesis.   Gus Rankin Tacarra Justo, MD    04/28/2011, 9:51 AM

## 2011-04-28 NOTE — Progress Notes (Signed)
Utilization review completed.  

## 2011-04-28 NOTE — Transfer of Care (Signed)
Immediate Anesthesia Transfer of Care Note  Patient: Shawn Keller  Procedure(s) Performed:  TOTAL KNEE ARTHROPLASTY  Patient Location: PACU  Anesthesia Type: Regional and Spinal  Level of Consciousness: awake, alert  and sedated  Airway & Oxygen Therapy: Patient Spontanous Breathing and Patient connected to face mask oxygen  Post-op Assessment: Report given to PACU RN and Post -op Vital signs reviewed and stable  Post vital signs: Reviewed and stable  Complications: No apparent anesthesia complications

## 2011-04-28 NOTE — Anesthesia Postprocedure Evaluation (Signed)
  Anesthesia Post-op Note  Patient: Shawn Keller  Procedure(s) Performed:  TOTAL KNEE ARTHROPLASTY  Patient Location: PACU  Anesthesia Type: Spinal  Level of Consciousness: awake and alert   Airway and Oxygen Therapy: Patient Spontanous Breathing  Post-op Pain: mild  Post-op Assessment: Post-op Vital signs reviewed, Patient's Cardiovascular Status Stable, Respiratory Function Stable, Patent Airway and No signs of Nausea or vomiting  Post-op Vital Signs: stable  Complications: No apparent anesthesia complications \

## 2011-04-29 ENCOUNTER — Encounter (HOSPITAL_COMMUNITY): Payer: Self-pay | Admitting: Orthopedic Surgery

## 2011-04-29 DIAGNOSIS — M1712 Unilateral primary osteoarthritis, left knee: Secondary | ICD-10-CM | POA: Diagnosis present

## 2011-04-29 LAB — BASIC METABOLIC PANEL
Calcium: 8.2 mg/dL — ABNORMAL LOW (ref 8.4–10.5)
GFR calc Af Amer: 73 mL/min — ABNORMAL LOW (ref >90)
GFR calc non Af Amer: 63 mL/min — ABNORMAL LOW (ref >90)
Glucose, Bld: 125 mg/dL — ABNORMAL HIGH (ref 70–99)
Potassium: 3.9 mEq/L (ref 3.5–5.1)
Sodium: 135 mEq/L (ref 135–145)

## 2011-04-29 LAB — CBC
MCH: 31.1 pg (ref 26.0–34.0)
MCHC: 34.4 g/dL (ref 30.0–36.0)
Platelets: 179 10*3/uL (ref 150–400)
RDW: 13.4 % (ref 11.5–15.5)

## 2011-04-29 MED ORDER — MORPHINE SULFATE 2 MG/ML IJ SOLN
1.0000 mg | INTRAMUSCULAR | Status: DC | PRN
  Administered 2011-04-29: 2 mg via INTRAVENOUS
  Filled 2011-04-29: qty 1

## 2011-04-29 NOTE — Progress Notes (Signed)
Subjective: 1 Day Post-Op Procedure(s) (LRB): TOTAL KNEE ARTHROPLASTY (Left) Patient reports pain as 10 on 0-10 scale and mild.   Patient seen in rounds with Dr. Lequita Halt. Patient has complaints of severe pain last night when it got out of control.  Blood Pressure also shot up during this time.  Pain under much better control now.  Encourage pills for good control today.  We will start therapy today. Plan is to go home after hospital stay.  Objective: Vital signs in last 24 hours: Temp:  [97.5 F (36.4 C)-99.6 F (37.6 C)] 99.6 F (37.6 C) (12/03 2135) Pulse Rate:  [48-68] 68  (12/03 2135) Resp:  [12-20] 16  (12/04 0355) BP: (152-212)/(69-83) 193/83 mmHg (12/03 2135) SpO2:  [98 %-100 %] 100 % (12/04 0355) Weight:  [113.399 kg (250 lb)] 250 lb (113.399 kg) (12/03 1253)  Intake/Output from previous day:  Intake/Output Summary (Last 24 hours) at 04/29/11 0706 Last data filed at 04/29/11 4098  Gross per 24 hour  Intake 6026.66 ml  Output   3125 ml  Net 2901.66 ml    Intake/Output this shift:    Labs: Results for orders placed during the hospital encounter of 04/28/11  TYPE AND SCREEN      Component Value Range   ABO/RH(D) B POS     Antibody Screen NEG     Sample Expiration 05/01/2011    ABO/RH      Component Value Range   ABO/RH(D) B POS    CBC      Component Value Range   WBC 7.1  4.0 - 10.5 (K/uL)   RBC 3.54 (*) 4.22 - 5.81 (MIL/uL)   Hemoglobin 11.0 (*) 13.0 - 17.0 (g/dL)   HCT 11.9 (*) 14.7 - 52.0 (%)   MCV 90.4  78.0 - 100.0 (fL)   MCH 31.1  26.0 - 34.0 (pg)   MCHC 34.4  30.0 - 36.0 (g/dL)   RDW 82.9  56.2 - 13.0 (%)   Platelets 179  150 - 400 (K/uL)  BASIC METABOLIC PANEL      Component Value Range   Sodium 135  135 - 145 (mEq/L)   Potassium 3.9  3.5 - 5.1 (mEq/L)   Chloride 99  96 - 112 (mEq/L)   CO2 29  19 - 32 (mEq/L)   Glucose, Bld 125 (*) 70 - 99 (mg/dL)   BUN 12  6 - 23 (mg/dL)   Creatinine, Ser 8.65  0.50 - 1.35 (mg/dL)   Calcium 8.2 (*) 8.4 -  10.5 (mg/dL)   GFR calc non Af Amer 63 (*) >90 (mL/min)   GFR calc Af Amer 73 (*) >90 (mL/min)    Exam - Neurovascular intact Sensation intact distally Dressing - clean, dry Motor function intact - moving foot and toes well on exam.  Hemovac pulled without difficulty.  Assessment/Plan: 1 Day Post-Op Procedure(s) (LRB): TOTAL KNEE ARTHROPLASTY (Left)  Hypertension - elevated pressure last night likely due to pain. Under better control at this time. Probably associated with wearing off of spinal.  Lisinopril is currently on hold.  Resumed Coreg.  Restart Lisinopril if pressure increases again.  Past Medical History  Diagnosis Date  . Shingles   . Pneumonia   . Hyperlipidemia   . Gout   . Statin myopathy     History of Statin Myalgias  . Mumps     Childhood Illness  . Measles     Childhood Illness  . Arthritis   . Renal calculi     kidney  stones, nocturia  . Cellulitis of lower leg     15 yrs ago  . Seasonal allergies   . Hypertension     hypercholesterolemia   LOV Dr AVA  with clearance 9/12,  EKG and CHEST  XRAY on chart    Advance diet Up with therapy Discharge home with home health when met goals.  DVT Prophylaxis - Xarelto  Protocol Weight-Bearing as tolerated to left leg Keep foley until tomorrow. No vaccines. D/C PCA, Change to IV push D/C O2 and Pulse OX and try on Room Air  PERKINS, ALEXZANDREW 04/29/2011, 7:06 AM

## 2011-04-29 NOTE — Progress Notes (Signed)
Occupational Therapy Evaluation Patient Details Name: Shawn Keller MRN: [id number] DOB: [birth year] Today's Date: 04/29/2011 1401 1420 ev1  Problem List:  Patient Active Problem List  Diagnoses  . Osteoarthritis of left knee    Past Medical History:  Past Medical History  Diagnosis Date  . Shingles   . Pneumonia   . Hyperlipidemia   . Gout   . Statin myopathy     History of Statin Myalgias  . Mumps     Childhood Illness  . Measles     Childhood Illness  . Arthritis   . Renal calculi     kidney stones, nocturia  . Cellulitis of lower leg     15 yrs ago  . Seasonal allergies   . Hypertension     hypercholesterolemia   LOV Dr AVA  with clearance 9/12,  EKG and CHEST  XRAY on chart   Past Surgical History:  Past Surgical History  Procedure Date  . Tonsillectomy   . Umbilical hernia repair   . Lithotripsy   . 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    Procedure: TOTAL KNEE ARTHROPLASTY;  Surgeon: Loanne Drilling;  Location: [facility];  Service: Orthopedics;  Laterality: Left;    OT Assessment/Plan/Recommendation OT Assessment Clinical Impression Statement: Pt seen for initial evaluation.  He has assist at home and all education completed.  He would benefit from 3:1 if he wants this:  He has an elevated toilet seat without rails and only has a sink/vanity next to one.    No further OT needs OT Recommendation/Assessment: Patient does not need any further OT services OT Recommendation Equipment Recommended: None recommended by PT OT Goals    OT Evaluation Precautions/Restrictions  Precautions Precautions: Knee Required Braces or Orthoses: Yes Knee Immobilizer: Discontinue once straight leg raise with < 10 degree lag Restrictions Weight Bearing Restrictions: No Prior Functioning Home Living Lives With: Spouse Receives Help From: Family Type of Home: House Home Layout: One level Home Access: Stairs to enter Entrance Stairs-Rails: None Entrance Stairs-Number of  Steps: 2 Bathroom Shower/Tub: Engineer, manufacturing systems: Standard (bought an elevated toilet seat (no rails) has sink next one commode.  Will use both Prior Function Level of Independence: Independent with basic ADLs;Independent with homemaking with ambulation Able to Take Stairs?: Yes Driving: Yes Vocation: Part time employment ADL ADL Grooming: Simulated;Set up Where Assessed - Grooming: Sitting, chair;Supported Upper Body Bathing: Simulated;Set up Where Assessed - Upper Body Bathing: Sitting, chair;Supported Lower Body Bathing: Simulated;Minimal assistance Where Assessed - Lower Body Bathing: Sit to stand from chair Upper Body Dressing: Simulated;Minimal assistance;Other (comment) (iv) Where Assessed - Upper Body Dressing: Sitting, chair;Supported Lower Body Dressing: Simulated;Moderate assistance Where Assessed - Lower Body Dressing: Sit to stand from chair Toilet Transfer: Simulated;Minimal assistance;Other (comment) (chair to bed) Toilet Transfer Method: Stand pivot Toilet Transfer Equipment: Other (comment) (recliner to bed) Toileting - Clothing Manipulation: Simulated;Minimal assistance (min guard) Where Assessed - Toileting Clothing Manipulation: Standing Toileting - Hygiene: Simulated;Minimal assistance;Other (comment) (min guard) Where Assessed - Toileting Hygiene: Standing Tub/Shower Transfer: Other (comment) (pt has tub:  will sponge bathe initially) ADL Comments: only one of bathrooms has vanity next to commode.  Would benefit from 3:1 if he wants it.  Educated on tub readiness.  Instructed in incentive spirometer Vision/Perception  Vision - History Patient Visual Report: No change from baseline Cognition Cognition Arousal/Alertness: Awake/alert Overall Cognitive Status: Appears within functional limits for tasks assessed Orientation Level: Oriented X4 Sensation/Coordination Sensation Light Touch: Appears Intact Coordination Gross Motor Movements  are Fluid  and Coordinated: Yes Fine Motor Movements are Fluid and Coordinated: Yes Extremity Assessment RUE Assessment RUE Assessment: Within Functional Limits LUE Assessment LUE Assessment: Within Functional Limits Mobility  Bed Mobility Bed Mobility: Yes Supine to Sit: 3: Mod assist;HOB flat Supine to Sit Details (indicate cue type and reason): assist for LLE onto bed Sit to Supine - Left: 4: Min assist Transfers Sit to Stand: 4: Min assist;From bed Sit to Stand Details (indicate cue type and reason): VC to push from bed Stand to Sit: 4: Min assist;To chair/3-in-1;With upper extremity assist;With armrests Stand to Sit Details: pt fell back into recliner with decreased control og descent Exercises Total Joint Exercises Ankle Circles/Pumps: AROM;Left;10 reps;Supine Quad Sets: AROM;Left;10 reps;Supine Heel Slides: AAROM;Left;10 reps;Supine Hip ABduction/ADduction: AAROM;Left;10 reps;Supine Straight Leg Raises: AAROM;Left;10 reps;Supine End of Session OT - End of Session Activity Tolerance: Patient tolerated treatment well Patient left: in bed;with call bell in reach;with family/visitor present General Behavior During Session: [facility] for tasks performed Cognition: [facility] for tasks performed   Katrinia Straker [phone] 04/29/2011, 4:03 PM

## 2011-04-29 NOTE — Progress Notes (Signed)
Physical Therapy Treatment Patient Details Name: RAYAN INES MRN: [id number] DOB: 1941/03/22 Today's Date: 04/29/2011 [id number] PT Assessment/Plan  PT - Assessment/Plan Comments on Treatment Session: pt is progressing well with min. c/o pain PT Plan: Discharge plan remains appropriate;Frequency remains appropriate PT Frequency: 7X/week Follow Up Recommendations: Home health PT Equipment Recommended: None recommended by PT PT Goals  Acute Rehab PT Goals PT Goal Formulation: With patient Time For Goal Achievement: 7 days Pt will go Supine/Side to Sit: with supervision;with HOB 0 degrees PT Goal: Supine/Side to Sit - Progress: Progressing toward goal Pt will go Sit to Supine/Side: with supervision PT Goal: Sit to Supine/Side - Progress: Progressing toward goal Pt will go Sit to Stand: with supervision;with HOB 0 degrees PT Goal: Sit to Stand - Progress: Progressing toward goal Pt will go Stand to Sit: with supervision;with HOB 0 degrees PT Goal: Stand to Sit - Progress: Progressing toward goal Pt will Ambulate: >150 feet;with supervision;with rolling walker PT Goal: Ambulate - Progress: Progressing toward goal Pt will Go Up / Down Stairs: 1-2 stairs;with min assist;with rolling walker PT Goal: Up/Down Stairs - Progress: Progressing toward goal Pt will Perform Home Exercise Program: with supervision, verbal cues required/provided PT Goal: Perform Home Exercise Program - Progress: Progressing toward goal  PT Treatment Precautions/Restrictions  Precautions Precautions: Knee Required Braces or Orthoses: Yes Knee Immobilizer: Discontinue once straight leg raise with < 10 degree lag Restrictions Weight Bearing Restrictions: No Mobility (including Balance) Bed Mobility Bed Mobility: Yes Supine to Sit: 3: Mod assist;HOB flat Supine to Sit Details (indicate cue type and reason): assist for LLE   Transfers Transfers: Yes Sit to Stand: 4: Min assist;From bed Sit to Stand  Details (indicate cue type and reason): VC to push from bed Stand to Sit: 4: Min assist;To chair/3-in-1;With upper extremity assist;With armrests Stand to Sit Details: pt fell back into recliner with decreased control of descent Ambulation/Gait Ambulation/Gait: Yes Ambulation/Gait Assistance: 4: Min assist Ambulation/Gait Assistance Details (indicate cue type and reason): vc for position inside RW Ambulation Distance (Feet): 300 Feet Assistive device: Rolling walker Gait Pattern: Step-through pattern Gait velocity: good clip (vc to slow down)  Posture/Postural Control Posture/Postural Control: No significant limitations Exercise   End of Session PT - End of Session Equipment Utilized During Treatment: Left knee immobilizer Activity Tolerance: Patient tolerated treatment well Patient left: with call bell in reach;in chair Nurse Communication: Mobility status for transfers General Behavior During Session: [facility] for tasks performed Cognition: [facility] for tasks performed  Rada Hay 04/29/2011, 3:04 PM

## 2011-04-29 NOTE — Progress Notes (Signed)
Physical Therapy Treatment Patient Details Name: Shawn Keller MRN: [id number] DOB: 1941-01-15 Today's Date: 04/29/2011 7829-5621 1g PT Assessment/Plan  PT - Assessment/Plan Comments on Treatment Session: pt is progressing well with min. c/o pain PT Plan: Discharge plan remains appropriate;Frequency remains appropriate PT Frequency: 7X/week Follow Up Recommendations: Home health PT Equipment Recommended: None recommended by PT PT Goals  Acute Rehab PT Goals PT Goal Formulation: With patient Time For Goal Achievement: 7 days Pt will go Supine/Side to Sit: with supervision;with HOB 0 degrees PT Goal: Supine/Side to Sit - Progress: Progressing toward goal Pt will go Sit to Supine/Side: with supervision PT Goal: Sit to Supine/Side - Progress: Progressing toward goal Pt will go Sit to Stand: with supervision;with HOB 0 degrees PT Goal: Sit to Stand - Progress: Progressing toward goal Pt will go Stand to Sit: with supervision;with HOB 0 degrees PT Goal: Stand to Sit - Progress: Progressing toward goal Pt will Ambulate: >150 feet;with supervision;with rolling walker PT Goal: Ambulate - Progress: Progressing toward goal Pt will Go Up / Down Stairs: 1-2 stairs;with min assist;with rolling walker PT Goal: Up/Down Stairs - Progress: Progressing toward goal Pt will Perform Home Exercise Program: with supervision, verbal cues required/provided PT Goal: Perform Home Exercise Program - Progress: Progressing toward goal  PT Treatment Precautions/Restrictions  Precautions Precautions: Knee Required Braces or Orthoses: Yes Knee Immobilizer: Discontinue once straight leg raise with < 10 degree lag Restrictions Weight Bearing Restrictions: No Mobility (including Balance) Bed Mobility Bed Mobility: Yes Sit to supine with assist to place LLE onto bed Transfers Transfers: Yes Sit to Stand: 4: Min assist;From chair/3-in-1;With armrests Sit to Stand Details (indicate cue type and reason): VC  to push from bed Stand to Sit: 4: Min assist;To chair/3-in-1;With upper extremity assist Stand to Sit Details: vc to step out LLE Ambulation/Gait Ambulation/Gait: Yes Ambulation/Gait Assistance: 4: Min assist Ambulation/Gait Assistance Details (indicate cue type and reason): vc for position inside RW Ambulation Distance (Feet): 200 Feet Assistive device: Rolling walker Gait Pattern: Step-to pattern Gait velocity: good clip  Posture/Postural Control Posture/Postural Control: No significant limitations Exercise   End of Session PT - End of Session Equipment Utilized During Treatment: Left knee immobilizer Activity Tolerance: Patient tolerated treatment well Patient left: with call bell in reach bed) with call bell Nurse Communication: Mobility status for transfers General Behavior During Session: Shawn Keller [facility] for tasks performed Cognition: [facility] for tasks performed  Rada Hay 04/29/2011, 2:53 PM

## 2011-04-29 NOTE — Progress Notes (Signed)
Physical Therapy Evaluation Patient Details Name: Shawn Keller MRN: [id number] DOB: 10/31/1940 Today's Date: 04/29/2011 [id number] Problem List:  Patient Active Problem List  Diagnoses  . Osteoarthritis of left knee    Past Medical History:  Past Medical History  Diagnosis Date  . Shingles   . Pneumonia   . Hyperlipidemia   . Gout   . Statin myopathy     History of Statin Myalgias  . Mumps     Childhood Illness  . Measles     Childhood Illness  . Arthritis   . Renal calculi     kidney stones, nocturia  . Cellulitis of lower leg     15 yrs ago  . Seasonal allergies   . Hypertension     hypercholesterolemia   LOV Dr AVA  with clearance 9/12,  EKG and CHEST  XRAY on chart   Past Surgical History:  Past Surgical History  Procedure Date  . Tonsillectomy   . Umbilical hernia repair   . Lithotripsy   . 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    Procedure: TOTAL KNEE ARTHROPLASTY;  Surgeon: Loanne Drilling;  Location: [facility];  Service: Orthopedics;  Laterality: Left;    PT Assessment/Plan/Recommendation PT Assessment Clinical Impression Statement: pt s/p LTKA  will benefit from acute PT  to improve functional mobility, ROM, strength to DC to home PT Recommendation/Assessment: Patient will need skilled PT in the acute care venue PT Problem List: Decreased strength;Decreased range of motion;Decreased activity tolerance;Decreased mobility;Decreased knowledge of use of DME;Decreased knowledge of precautions;Pain PT Therapy Diagnosis : Difficulty walking;Acute pain PT Plan PT Frequency: 7X/week PT Treatment/Interventions: DME instruction;Gait training;Stair training;Functional mobility training;Therapeutic activities;Therapeutic exercise;Patient/family education PT Recommendation Recommendations for Other Services: OT consult Follow Up Recommendations: Home health PT Equipment Recommended: None recommended by PT PT Goals  Acute Rehab PT Goals PT Goal Formulation: With  patient Time For Goal Achievement: 7 days Pt will go Supine/Side to Sit: with supervision;with HOB 0 degrees PT Goal: Supine/Side to Sit - Progress: Progressing toward goal Pt will go Sit to Supine/Side: with supervision PT Goal: Sit to Supine/Side - Progress: Progressing toward goal Pt will go Sit to Stand: with supervision;with HOB 0 degrees PT Goal: Sit to Stand - Progress: Progressing toward goal Pt will go Stand to Sit: with supervision;with HOB 0 degrees PT Goal: Stand to Sit - Progress: Progressing toward goal Pt will Ambulate: >150 feet;with supervision;with rolling walker PT Goal: Ambulate - Progress: Progressing toward goal Pt will Go Up / Down Stairs: 1-2 stairs;with min assist;with rolling walker PT Goal: Up/Down Stairs - Progress: Progressing toward goal Pt will Perform Home Exercise Program: with supervision, verbal cues required/provided PT Goal: Perform Home Exercise Program - Progress: Progressing toward goal  PT Evaluation Precautions/Restrictions  Precautions Precautions: Knee Required Braces or Orthoses: Yes Knee Immobilizer: Discontinue once straight leg raise with < 10 degree lag Restrictions Weight Bearing Restrictions: No Prior Functioning  Home Living Lives With: Spouse Receives Help From: Family Type of Home: House Home Layout: One level Home Access: Stairs to enter Entrance Stairs-Rails: None Entrance Stairs-Number of Steps: 2 Prior Function Level of Independence: Independent with basic ADLs;Independent with homemaking with ambulation Able to Take Stairs?: Yes Driving: Yes Vocation: Part time employment Cognition Cognition Arousal/Alertness: Awake/alert Overall Cognitive Status: Appears within functional limits for tasks assessed Orientation Level: Oriented X4 Sensation/Coordination Sensation Light Touch: Appears Intact Coordination Gross Motor Movements are Fluid and Coordinated: Yes Fine Motor Movements are Fluid and Coordinated:  Yes Extremity Assessment RUE Assessment  RUE Assessment: Within Functional Limits LUE Assessment LUE Assessment: Within Functional Limits RLE Assessment RLE Assessment: Within Functional Limits LLE Assessment LLE Assessment: Exceptions to WFL LLE AROM (degrees) LLE Overall AROM Comments: pt  able to flex Knee to40 in supine,  LLE Strength LLE Overall Strength Comments: able to perform SLR with KI in place Mobility (including Balance) Bed Mobility Bed Mobility: Yes Supine to Sit: 3: Mod assist;With rails;HOB elevated (Comment degrees) (30) Supine to Sit Details (indicate cue type and reason): pt able to slide LLE to EOB Transfers Transfers: Yes Sit to Stand: 3: Mod assist;From elevated surface;With upper extremity assist;From bed Sit to Stand Details (indicate cue type and reason): VC to push from bed Stand to Sit: 3: Mod assist;With armrests;With upper extremity assist;To chair/3-in-1 Stand to Sit Details: Vc to place LLE and reach back Ambulation/Gait Ambulation/Gait: Yes Ambulation/Gait Assistance: 4: Min assist Ambulation/Gait Assistance Details (indicate cue type and reason): VC for sequence and position inside RW Ambulation Distance (Feet): 100 Feet Assistive device: Rolling walker Gait Pattern: Step-to pattern Gait velocity: good clip  Posture/Postural Control Posture/Postural Control: No significant limitations Exercise  Total Joint Exercises Ankle Circles/Pumps: AROM;Left;10 reps;Supine Quad Sets: AROM;Left;10 reps;Supine Heel Slides: AAROM;Left;10 reps;Supine Hip ABduction/ADduction: AAROM;Left;10 reps;Supine Straight Leg Raises: AAROM;Left;10 reps;Supine End of Session PT - End of Session Equipment Utilized During Treatment: Left knee immobilizer (bed sheet for Exercise) Activity Tolerance: Patient tolerated treatment well Patient left: in chair;with call bell in reach Nurse Communication: Mobility status for transfers General Behavior During Session: [facility] -  for  tasks performed Cognition: [facility] for tasks performed  Blanchard Kelch [phone] 04/29/2011, 2:44 PM

## 2011-04-30 LAB — BASIC METABOLIC PANEL
BUN: 13 mg/dL (ref 6–23)
Calcium: 8.7 mg/dL (ref 8.4–10.5)
Chloride: 99 mEq/L (ref 96–112)
Creatinine, Ser: 1.26 mg/dL (ref 0.50–1.35)
GFR calc Af Amer: 65 mL/min — ABNORMAL LOW (ref >90)
GFR calc non Af Amer: 56 mL/min — ABNORMAL LOW (ref >90)

## 2011-04-30 LAB — CBC
MCHC: 34 g/dL (ref 30.0–36.0)
MCV: 89.7 fL (ref 78.0–100.0)
Platelets: 180 10*3/uL (ref 150–400)
RDW: 13.3 % (ref 11.5–15.5)
WBC: 8.6 10*3/uL (ref 4.0–10.5)

## 2011-04-30 MED ORDER — ZOLPIDEM TARTRATE 5 MG PO TABS
5.0000 mg | ORAL_TABLET | Freq: Every evening | ORAL | Status: DC | PRN
Start: 2011-04-30 — End: 2011-04-30

## 2011-04-30 MED ORDER — METHOCARBAMOL 500 MG PO TABS: 500.0000 mg | ORAL_TABLET | Freq: Four times a day (QID) | ORAL | Status: AC | PRN

## 2011-04-30 MED ORDER — RIVAROXABAN 10 MG PO TABS: 10.0000 mg | ORAL_TABLET | Freq: Every day | ORAL | Status: DC

## 2011-04-30 MED ORDER — OXYCODONE HCL 5 MG PO TABS: 5.0000 mg | ORAL_TABLET | ORAL | Status: AC | PRN

## 2011-04-30 NOTE — Progress Notes (Signed)
04/30/2011 Colleen Can RN BSN CCM [phone] CM spoke with patient. Plans are for patient to return to his home in [place] [place] [facility] where spouse will be caregiver. States he already has DME and wants [facility] for [facility] services. Has used them in the past. [facility] will be able to services pt and will start services tomorrow.

## 2011-04-30 NOTE — Progress Notes (Signed)
Physical Therapy Treatment Patient Details Name: Shawn Keller MRN: [id number] DOB: Dec 11, 1940 Today's Date: 04/30/2011 110-127 g PT Assessment/Plan  PT - Assessment/Plan Comments on Treatment Session: pt is progressing well with min. c/o pain PT Plan: Discharge plan remains appropriate;Frequency remains appropriate PT Frequency: 7X/week Follow Up Recommendations: Home health PT Equipment Recommended: None recommended by PT PT Goals  Acute Rehab PT Goals PT Goal Formulation: With patient Time For Goal Achievement: 7 days Pt will go Supine/Side to Sit: with supervision;with HOB 0 degrees PT Goal: Supine/Side to Sit - Progress: Partly met Pt will go Sit to Supine/Side: with supervision PT Goal: Sit to Supine/Side - Progress: Partly met Pt will go Sit to Stand: with supervision;with HOB 0 degrees PT Goal: Sit to Stand - Progress: Progressing toward goal Pt will go Stand to Sit: with supervision;with HOB 0 degrees PT Goal: Stand to Sit - Progress: Progressing toward goal Pt will Ambulate: >150 feet;with supervision;with rolling walker PT Goal: Ambulate - Progress: Met Pt will Go Up / Down Stairs: 1-2 stairs;with min assist;with rolling walker PT Goal: Up/Down Stairs - Progress: Met Pt will Perform Home Exercise Program: with supervision, verbal cues required/provided PT Goal: Perform Home Exercise Program - Progress: Met  PT Treatment Precautions/Restrictions  Precautions Precautions: Knee Required Braces or Orthoses: Yes Knee Immobilizer: Discontinue once straight leg raise with < 10 degree lag Restrictions Weight Bearing Restrictions: No Mobility (including Balance) Bed Mobility Bed Mobility: Yes Sit to Supine - Left: 4: Min assist Transfers Transfers: Yes Sit to Stand: From chair/3-in-1 Stand to Sit: With upper extremity assist;To bed;5: Supervision Ambulation/Gait Ambulation/Gait: Yes Ambulation/Gait Assistance: 5: Supervision Ambulation Distance (Feet): 400  Feet Assistive device: Rolling walker Gait Pattern: Step-through pattern Gait velocity: good clip (vc to slow down) Stairs: Yes Height of Stairs: 6   Posture/Postural Control Posture/Postural Control: No significant limitations Exercise  Total Joint Exercises Ankle Circles/Pumps: AROM;Left;10 reps;Supine Quad Sets: AROM;Left;10 reps;Supine Heel Slides: AAROM;Left;10 reps;Supine Hip ABduction/ADduction: AAROM;Left;10 reps;Supine Straight Leg Raises: AAROM;Left;10 reps;Supine End of Session PT - End of Session Equipment Utilized During Treatment: Left knee immobilizer Activity Tolerance: Patient tolerated treatment well Patient left: with call bell in reach;in chair (in BR, pt to call CNA) Nurse Communication: Mobility status for transfers General Behavior During Session: [facility] for tasks performed Cognition: [facility] for tasks performed  Rada Hay 04/30/2011, 4:38 PM

## 2011-04-30 NOTE — Progress Notes (Signed)
Subjective: 2 Days Post-Op Procedure(s) (LRB): TOTAL KNEE ARTHROPLASTY (Left) Patient reports pain as mild.   Patient seen in rounds with Dr. Lequita Halt. Patient has complaints of soreness after walking.  Objective: Vital signs in last 24 hours: Temp:  [98.5 F (36.9 C)-100.3 F (37.9 C)] 99.2 F (37.3 C) (12/05 0008) Pulse Rate:  [71-92] 85  (12/04 2225) Resp:  [12-16] 12  (12/04 2225) BP: (165-203)/(76-92) 168/77 mmHg (12/04 2225) SpO2:  [87 %-96 %] 96 % (12/04 2225)  Intake/Output from previous day:  Intake/Output Summary (Last 24 hours) at 04/30/11 1610 Last data filed at 04/30/11 0557  Gross per 24 hour  Intake 1731.67 ml  Output   2025 ml  Net -293.33 ml    Intake/Output this shift:    Labs: Results for orders placed during the hospital encounter of 04/28/11  TYPE AND SCREEN      Component Value Range   ABO/RH(D) B POS     Antibody Screen NEG     Sample Expiration 05/01/2011    ABO/RH      Component Value Range   ABO/RH(D) B POS    CBC      Component Value Range   WBC 7.1  4.0 - 10.5 (K/uL)   RBC 3.54 (*) 4.22 - 5.81 (MIL/uL)   Hemoglobin 11.0 (*) 13.0 - 17.0 (g/dL)   HCT 96.0 (*) 45.4 - 52.0 (%)   MCV 90.4  78.0 - 100.0 (fL)   MCH 31.1  26.0 - 34.0 (pg)   MCHC 34.4  30.0 - 36.0 (g/dL)   RDW 09.8  11.9 - 14.7 (%)   Platelets 179  150 - 400 (K/uL)  BASIC METABOLIC PANEL      Component Value Range   Sodium 135  135 - 145 (mEq/L)   Potassium 3.9  3.5 - 5.1 (mEq/L)   Chloride 99  96 - 112 (mEq/L)   CO2 29  19 - 32 (mEq/L)   Glucose, Bld 125 (*) 70 - 99 (mg/dL)   BUN 12  6 - 23 (mg/dL)   Creatinine, Ser 8.29  0.50 - 1.35 (mg/dL)   Calcium 8.2 (*) 8.4 - 10.5 (mg/dL)   GFR calc non Af Amer 63 (*) >90 (mL/min)   GFR calc Af Amer 73 (*) >90 (mL/min)  CBC      Component Value Range   WBC 8.6  4.0 - 10.5 (K/uL)   RBC 3.31 (*) 4.22 - 5.81 (MIL/uL)   Hemoglobin 10.1 (*) 13.0 - 17.0 (g/dL)   HCT 56.2 (*) 13.0 - 52.0 (%)   MCV 89.7  78.0 - 100.0 (fL)   MCH  30.5  26.0 - 34.0 (pg)   MCHC 34.0  30.0 - 36.0 (g/dL)   RDW 86.5  78.4 - 69.6 (%)   Platelets 180  150 - 400 (K/uL)  BASIC METABOLIC PANEL      Component Value Range   Sodium 135  135 - 145 (mEq/L)   Potassium 4.0  3.5 - 5.1 (mEq/L)   Chloride 99  96 - 112 (mEq/L)   CO2 31  19 - 32 (mEq/L)   Glucose, Bld 106 (*) 70 - 99 (mg/dL)   BUN 13  6 - 23 (mg/dL)   Creatinine, Ser 2.95  0.50 - 1.35 (mg/dL)   Calcium 8.7  8.4 - 28.4 (mg/dL)   GFR calc non Af Amer 56 (*) >90 (mL/min)   GFR calc Af Amer 65 (*) >90 (mL/min)    Exam - Neurovascular intact Sensation intact distally  clean, dry and no drainage Dressing/Incision - no drainage Motor function intact - moving foot and toes well on exam.   Assessment/Plan: 2 Days Post-Op Procedure(s) (LRB): TOTAL KNEE ARTHROPLASTY (Left)  Past Medical History  Diagnosis Date  . Shingles   . Pneumonia   . Hyperlipidemia   . Gout   . Statin myopathy     History of Statin Myalgias  . Mumps     Childhood Illness  . Measles     Childhood Illness  . Arthritis   . Renal calculi     kidney stones, nocturia  . Cellulitis of lower leg     15 yrs ago  . Seasonal allergies   . Hypertension     hypercholesterolemia   LOV Dr AVA  with clearance 9/12,  EKG and CHEST  XRAY on chart    Advance diet Up with therapy Discharge home with home health possibly later this afternoon  DVT Prophylaxis - Xarelto Protocol Weight-Bearing as tolerated to left leg  PERKINS, ALEXZANDREW 04/30/2011, 7:12 AM

## 2011-04-30 NOTE — Discharge Summary (Signed)
Physician Discharge Summary   Patient ID: Shawn Keller MRN: [id number] DOB/AGE: 71-Nov-1942 71 y.o.  Admit date: 04/28/2011 Discharge date: 04/30/2011  Primary Diagnosis: Osteoarthritis Left Knee  Admission Diagnoses: Past Medical History  Diagnosis Date  . Shingles   . Pneumonia   . Hyperlipidemia   . Gout   . Statin myopathy     History of Statin Myalgias  . Mumps     Childhood Illness  . Measles     Childhood Illness  . Arthritis   . Renal calculi     kidney stones, nocturia  . Cellulitis of lower leg     15 yrs ago  . Seasonal allergies   . Hypertension     hypercholesterolemia   LOV Dr AVA  with clearance 9/12,  EKG and CHEST  XRAY on chart    Discharge Diagnoses:  Principal Problem:  *Osteoarthritis of left knee  Procedure: Procedure(s) (LRB): TOTAL KNEE ARTHROPLASTY (Left)   Consults: none  HPI:  Shawn Keller is a 70 y.o. year old male with end stage OA of his left knee with progressively worsening pain and dysfunction. He has constant pain, with activity and at rest and significant functional deficits with difficulties even with ADLs. He has had extensive non-op management including analgesics, injections of cortisone and viscosupplements, and home exercise program, but remains in significant pain with significant dysfunction. Radiographs show bone on bone arthritis all 3 compartments with large osteophyte formation. He presents now for left Total Knee Arthroplasty.    Laboratory Data: Results for orders placed during the hospital encounter of 04/28/11  TYPE AND SCREEN      Component Value Range   ABO/RH(D) B POS     Antibody Screen NEG     Sample Expiration 05/01/2011    ABO/RH      Component Value Range   ABO/RH(D) B POS    CBC      Component Value Range   WBC 7.1  4.0 - 10.5 (K/uL)   RBC 3.54 (*) 4.22 - 5.81 (MIL/uL)   Hemoglobin 11.0 (*) 13.0 - 17.0 (g/dL)   HCT 11.9 (*) 14.7 - 52.0 (%)   MCV 90.4  78.0 - 100.0 (fL)   MCH 31.1  26.0  - 34.0 (pg)   MCHC 34.4  30.0 - 36.0 (g/dL)   RDW 82.9  56.2 - 13.0 (%)   Platelets 179  150 - 400 (K/uL)  BASIC METABOLIC PANEL      Component Value Range   Sodium 135  135 - 145 (mEq/L)   Potassium 3.9  3.5 - 5.1 (mEq/L)   Chloride 99  96 - 112 (mEq/L)   CO2 29  19 - 32 (mEq/L)   Glucose, Bld 125 (*) 70 - 99 (mg/dL)   BUN 12  6 - 23 (mg/dL)   Creatinine, Ser 8.65  0.50 - 1.35 (mg/dL)   Calcium 8.2 (*) 8.4 - 10.5 (mg/dL)   GFR calc non Af Amer 63 (*) >90 (mL/min)   GFR calc Af Amer 73 (*) >90 (mL/min)  CBC      Component Value Range   WBC 8.6  4.0 - 10.5 (K/uL)   RBC 3.31 (*) 4.22 - 5.81 (MIL/uL)   Hemoglobin 10.1 (*) 13.0 - 17.0 (g/dL)   HCT 78.4 (*) 69.6 - 52.0 (%)   MCV 89.7  78.0 - 100.0 (fL)   MCH 30.5  26.0 - 34.0 (pg)   MCHC 34.0  30.0 - 36.0 (g/dL)   RDW 13.3  11.5 - 15.5 (%)   Platelets 180  150 - 400 (K/uL)  BASIC METABOLIC PANEL      Component Value Range   Sodium 135  135 - 145 (mEq/L)   Potassium 4.0  3.5 - 5.1 (mEq/L)   Chloride 99  96 - 112 (mEq/L)   CO2 31  19 - 32 (mEq/L)   Glucose, Bld 106 (*) 70 - 99 (mg/dL)   BUN 13  6 - 23 (mg/dL)   Creatinine, Ser 1.61  0.50 - 1.35 (mg/dL)   Calcium 8.7  8.4 - 09.6 (mg/dL)   GFR calc non Af Amer 56 (*) >90 (mL/min)   GFR calc Af Amer 65 (*) >90 (mL/min)    Hospital Outpatient Visit on 04/24/2011  Component Date Value Range Status  . aPTT (seconds) 04/24/2011 32  24-37 Final  . WBC (K/uL) 04/24/2011 7.3  4.0-10.5 Final  . RBC (MIL/uL) 04/24/2011 4.67  4.22-5.81 Final  . Hemoglobin (g/dL) 04/54/0981 19.1  47.8-29.5 Final  . HCT (%) 04/24/2011 42.2  39.0-52.0 Final  . MCV (fL) 04/24/2011 90.4  78.0-100.0 Final  . MCH (pg) 04/24/2011 30.6  26.0-34.0 Final  . MCHC (g/dL) 62/13/0865 78.4  69.6-29.5 Final  . RDW (%) 04/24/2011 13.5  11.5-15.5 Final  . Platelets (K/uL) 04/24/2011 238  150-400 Final  . Sodium (mEq/L) 04/24/2011 136  135-145 Final  . Potassium (mEq/L) 04/24/2011 4.2  3.5-5.1 Final  . Chloride (mEq/L)  04/24/2011 102  96-112 Final  . CO2 (mEq/L) 04/24/2011 27  19-32 Final  . Glucose, Bld (mg/dL) 28/41/3244 92  01-02 Final  . BUN (mg/dL) 72/53/6644 21  0-34 Final  . Creatinine, Ser (mg/dL) 74/25/9563 8.75  6.43-3.29 Final  . Calcium (mg/dL) 51/88/4166 9.4  0.6-30.1 Final  . Total Protein (g/dL) 60/02/9322 7.8  5.5-7.3 Final  . Albumin (g/dL) 22/06/5425 3.9  0.6-2.3 Final  . AST (U/L) 04/24/2011 22  0-37 Final  . ALT (U/L) 04/24/2011 15  0-53 Final  . Alkaline Phosphatase (U/L) 04/24/2011 70  39-117 Final  . Total Bilirubin (mg/dL) 76/28/3151 0.5  7.6-1.6 Final  . GFR calc non Af Amer (mL/min) 04/24/2011 64* >90 Final  . GFR calc Af Amer (mL/min) 04/24/2011 74* >90 Final   Comment:                                 The eGFR has been calculated                          using the CKD EPI equation.                          This calculation has not been                          validated in all clinical                          situations.                          eGFR's persistently                          <90 mL/min signify  possible Chronic Kidney Disease.  Marland Kitchen Prothrombin Time (seconds) 04/24/2011 13.6  11.6-15.2 Final  . INR  04/24/2011 1.02  0.00-1.49 Final  . Color, Urine  04/24/2011 YELLOW  YELLOW Final  . APPearance  04/24/2011 CLEAR  CLEAR Final  . Specific Gravity, Urine  04/24/2011 1.017  1.005-1.030 Final  . pH  04/24/2011 6.0  5.0-8.0 Final  . Glucose, UA (mg/dL) 04/54/0981 NEGATIVE  NEGATIVE Final  . Hgb urine dipstick  04/24/2011 NEGATIVE  NEGATIVE Final  . Bilirubin Urine  04/24/2011 NEGATIVE  NEGATIVE Final  . Ketones, ur (mg/dL) 19/14/7829 NEGATIVE  NEGATIVE Final  . Protein, ur (mg/dL) 56/21/3086 NEGATIVE  NEGATIVE Final  . Urobilinogen, UA (mg/dL) 57/84/6962 0.2  9.5-2.8 Final  . Nitrite  04/24/2011 NEGATIVE  NEGATIVE Final  . Leukocytes, UA  04/24/2011 NEGATIVE  NEGATIVE Final   MICROSCOPIC NOT DONE ON URINES WITH NEGATIVE PROTEIN, BLOOD,  LEUKOCYTES, NITRITE, OR GLUCOSE <1000 mg/dL.  Marland Kitchen MRSA, PCR  04/24/2011 NEGATIVE  NEGATIVE Final  . Staphylococcus aureus  04/24/2011 NEGATIVE  NEGATIVE Final   Comment:                                 The Xpert SA Assay (FDA                          approved for NASAL specimens                          only), is one component of                          a comprehensive surveillance                          program.  It is not intended                          to diagnose infection nor to                          guide or monitor treatment.    X-Rays:No results found.  EKG:No orders found for this or any previous visit.   Hospital Course: Patient was admitted to [facility] and taken to the OR and underwent the above state procedure without complications.  Patient tolerated the procedure well and was later transferred to the recovery room and then to the orthopaedic floor for postoperative care.  They were given PO and IV analgesics for pain control following their surgery.  They were given 24 hours of postoperative antibiotics and started on DVT prophylaxis.   PT and OT were ordered for total joint protocol.  Discharge planning consulted to help with postop disposition and equipment needs.  Patient had a decent night on the evening of surgery and started to get up with therapy on day one and did very well with his walking.  PCA was discontinued and they were weaned over to PO meds.  Hemovac drain was pulled without difficulty.  Continued to progress with therapy into day two.  Dressing was changed on day two and the incision was healing well. He continued to progress with therapy and meeting goals.  and was ready to go home later that afternoon.  Discharge Medications: Prior to Admission medications   Medication Sig Start Date End Date Taking? Authorizing Provider  carvedilol (COREG) 25 MG tablet Take 12.5 mg by mouth 2 (two) times daily with a meal.     Historical Provider, MD    lisinopril (PRINIVIL,ZESTRIL) 2.5 MG tablet Take by mouth every evening.     Historical Provider, MD  lisinopril (PRINIVIL,ZESTRIL) 20 MG tablet Take 20 mg by mouth every evening.      Historical Provider, MD  methocarbamol (ROBAXIN) 500 MG tablet Take 1 tablet (500 mg total) by mouth every 6 (six) hours as needed. 04/30/11 05/10/11  Cosme Jacob, PA  oxyCODONE (OXY IR/ROXICODONE) 5 MG immediate release tablet Take 1-2 tablets (5-10 mg total) by mouth every 4 (four) hours as needed for pain. 04/30/11 05/10/11  Ross Hefferan Julien Girt, PA  rivaroxaban (XARELTO) 10 MG TABS tablet Take 1 tablet (10 mg total) by mouth daily with breakfast. 04/30/11   Shrihan Putt Julien Girt, PA    Diet: heart healthy  Activity:WBAT   Follow-up:in 2 weeks  Disposition: Home  Discharged Condition: good   Discharge Orders    Future Orders Please Complete By Expires   Diet - low sodium heart healthy      Call MD / Call 911      Comments:   If you experience chest pain or shortness of breath, CALL 911 and be transported to the hospital emergency room.  If you develope a fever above 101 F, pus (white drainage) or increased drainage or redness at the wound, or calf pain, call your surgeon's office.   Constipation Prevention      Comments:   Drink plenty of fluids.  Prune juice may be helpful.  You may use a stool softener, such as Colace (over the counter) 100 mg twice a day.  Use MiraLax (over the counter) for constipation as needed.   Increase activity slowly as tolerated      Weight Bearing as taught in Physical Therapy      Comments:   Use a walker or crutches as instructed.   Discharge instructions      Comments:   Pick up stool softner and laxative for home. Do not submerge incision under water. May shower. l.   Driving restrictions      Comments:   No driving   Lifting restrictions      Comments:   No lifting     Discharge Medication List as of 04/30/2011  5:50 PM    START taking these  medications   Details  methocarbamol (ROBAXIN) 500 MG tablet Take 1 tablet (500 mg total) by mouth every 6 (six) hours as needed., Starting 04/30/2011, Until Sat 05/10/11, Print    oxyCODONE (OXY IR/ROXICODONE) 5 MG immediate release tablet Take 1-2 tablets (5-10 mg total) by mouth every 4 (four) hours as needed for pain., Starting 04/30/2011, Until Sat 05/10/11, Print    rivaroxaban (XARELTO) 10 MG TABS tablet Take 1 tablet (10 mg total) by mouth daily with breakfast., Starting 04/30/2011, Until Discontinued, Print      CONTINUE these medications which have NOT CHANGED   Details  carvedilol (COREG) 25 MG tablet Take 12.5 mg by mouth 2 (two) times daily with a meal. , Until Discontinued, Historical Med    !! lisinopril (PRINIVIL,ZESTRIL) 2.5 MG tablet Take by mouth every evening. , Until Discontinued, Historical Med    !! lisinopril (PRINIVIL,ZESTRIL) 20 MG tablet Take 20 mg by mouth every evening.  , Until Discontinued, Historical Med     !! -  Potential duplicate medications found. Please discuss with provider.    STOP taking these medications     acetaminophen (TYLENOL) 500 MG tablet      aspirin 325 MG tablet      fish oil-omega-3 fatty acids 1000 MG capsule      Glucosamine-Chondroitin (GLUCOSAMINE CHONDR COMPLEX PO)      ibuprofen (ADVIL,MOTRIN) 200 MG tablet        Follow-up Information    Follow up with ALUISIO,FRANK V. Make an appointment in 2 weeks.   Contact information:   [facility] [address], [address] [place] [address] [phone]          Signed: Patrica Duel 04/30/2011, 9:46 PM

## 2011-04-30 NOTE — Progress Notes (Signed)
Physical Therapy Treatment Patient Details Name: Shawn Keller MRN: [id number] DOB: 1941-04-22 Today's Date: 04/30/2011 [id number] PT Assessment/Plan  PT - Assessment/Plan Comments on Treatment Session: pt is progressing well with min. c/o pain PT Plan: Discharge plan remains appropriate;Frequency remains appropriate PT Frequency: 7X/week Follow Up Recommendations: Home health PT Equipment Recommended: None recommended by PT PT Goals  Acute Rehab PT Goals PT Goal Formulation: With patient Time For Goal Achievement: 7 days Pt will go Supine/Side to Sit: with supervision;with HOB 0 degrees Pt will go Sit to Supine/Side: with supervision Pt will go Sit to Stand: with supervision;with HOB 0 degrees Pt will go Stand to Sit: with supervision;with HOB 0 degrees Pt will Ambulate: >150 feet;with supervision;with rolling walker Pt will Perform Home Exercise Program: with supervision, verbal cues required/provided  PT Treatment Precautions/Restrictions  Precautions Precautions: Knee Required Braces or Orthoses: Yes Knee Immobilizer: Discontinue once straight leg raise with < 10 degree lag Restrictions Weight Bearing Restrictions: No Mobility (including Balance) Bed Mobility Bed Mobility: Yes Supine to Sit: 4: Min assist (pt used trapeze even with instruction not to) Sit to Supine - Left: 4: Min assist Transfers Transfers: Yes Sit to Stand: 4: Min assist;From bed Stand to Sit: 4: Min assist;To chair/3-in-1;With upper extremity assist;With armrests Stand to Sit Details: pt sat down quickly with decreased control of descent Ambulation/Gait Ambulation/Gait: Yes Ambulation/Gait Assistance: 5: Supervision Ambulation/Gait Assistance Details (indicate cue type and reason): vc for posture and roll rw Ambulation Distance (Feet): 400 Feet Assistive device: Rolling walker Gait Pattern: Step-through pattern Gait velocity: good clip (vc to slow down) Stairs: Yes Stairs Assistance: 4:  Min assist Stairs Assistance Details (indicate cue type and reason): Min a for support of RW with wif present to assist/ practiced x 3 for safety  Stair Management Technique: No rails;Backwards;With walker Number of Stairs: 2  Height of Stairs: 6   Posture/Postural Control Posture/Postural Control: No significant limitations Exercise  Total Joint Exercises Ankle Circles/Pumps: AROM;Left;10 reps;Supine Quad Sets: AROM;Left;10 reps;Supine Heel Slides: AAROM;Left;10 reps;Supine Hip ABduction/ADduction: AAROM;Left;10 reps;Supine Straight Leg Raises: AAROM;Left;10 reps;Supine End of Session PT - End of Session Equipment Utilized During Treatment: Left knee immobilizer Activity Tolerance: Patient tolerated treatment well Patient left: with call bell in reach;in chair (in BR, pt to call CNA) Nurse Communication: Mobility status for transfers General Behavior During Session: [facility] for tasks performed Cognition: [facility] for tasks performed  Rada Hay 04/30/2011, 11:09 AM

## 2012-02-26 ENCOUNTER — Other Ambulatory Visit: Payer: Self-pay | Admitting: Orthopedic Surgery

## 2012-02-26 MED ORDER — BUPIVACAINE 0.25 % ON-Q PUMP SINGLE CATH 300ML
300.0000 mL | INJECTION | Status: DC
Start: 2012-02-26 — End: 2012-02-26

## 2012-02-26 MED ORDER — DEXAMETHASONE SODIUM PHOSPHATE 10 MG/ML IJ SOLN: 10.0000 mg | Freq: Once | INTRAMUSCULAR | Status: DC

## 2012-02-26 NOTE — Progress Notes (Signed)
Preoperative surgical orders have been place into the Epic hospital system for Gar Ponto on 02/26/2012, 2:05 PM  by Patrica Duel for surgery on 03/29/2012.  Preop Total Knee orders including Bupivacaine On-Q pump, IV Tylenol, and IV Decadron as long as there are no contraindications to the above medications. Avel Peace, PA-C

## 2012-03-19 ENCOUNTER — Encounter (HOSPITAL_COMMUNITY): Payer: Self-pay | Admitting: Pharmacy Technician

## 2012-03-22 NOTE — Patient Instructions (Signed)
20 SABA NEUMAN  03/22/2012   Your procedure is scheduled on: 0835am-0940am  Report to [facility] at 0600 AM.  Call this number if you have problems the morning of surgery: [phone]   Remember:   Do not eat food:After Midnight.  May have clear liquids:until Midnight .  Marland Kitchen  Take these medicines the morning of surgery with A SIP OF WATER:    Do not wear jewelry,   Do not wear lotions, powders, or perfumes.   . Men may shave face and neck.  Do not bring valuables to the hospital.  Contacts, dentures or bridgework may not be worn into surgery.  Leave suitcase in the car. After surgery it may be brought to your room.  For patients admitted to the hospital, checkout time is 11:00 AM the day of discharge.                 SEE CHG INSTRUCTION SHEET    Please read over the following fact sheets that you were given: MRSA Information, Blood Transfusion Fact Sheet, coughing and deep breathing exercises, leg exercises, Incentive Spirometry Fact sheet

## 2012-03-23 ENCOUNTER — Encounter (HOSPITAL_COMMUNITY): Payer: Self-pay

## 2012-03-23 ENCOUNTER — Encounter (HOSPITAL_COMMUNITY)
Admission: RE | Admit: 2012-03-23 | Discharge: 2012-03-23 | Disposition: A | Discharge status: Home / Self Care | Payer: Medicare Other | Source: Ambulatory Visit | Attending: Orthopedic Surgery | Admitting: Orthopedic Surgery

## 2012-03-23 ENCOUNTER — Ambulatory Visit (HOSPITAL_COMMUNITY)
Admission: RE | Admit: 2012-03-23 | Discharge: 2012-03-23 | Disposition: A | Discharge status: Home / Self Care | Payer: Medicare Other | Source: Ambulatory Visit | Attending: Orthopedic Surgery | Admitting: Orthopedic Surgery

## 2012-03-23 DIAGNOSIS — I77819 Aortic ectasia, unspecified site: Secondary | ICD-10-CM | POA: Insufficient documentation

## 2012-03-23 DIAGNOSIS — Z01812 Encounter for preprocedural laboratory examination: Secondary | ICD-10-CM | POA: Insufficient documentation

## 2012-03-23 DIAGNOSIS — Z87891 Personal history of nicotine dependence: Secondary | ICD-10-CM | POA: Insufficient documentation

## 2012-03-23 DIAGNOSIS — Z01818 Encounter for other preprocedural examination: Secondary | ICD-10-CM | POA: Insufficient documentation

## 2012-03-23 DIAGNOSIS — I517 Cardiomegaly: Secondary | ICD-10-CM | POA: Insufficient documentation

## 2012-03-23 LAB — SURGICAL PCR SCREEN
MRSA, PCR: NEGATIVE
Staphylococcus aureus: NEGATIVE

## 2012-03-23 LAB — URINALYSIS, ROUTINE W REFLEX MICROSCOPIC
Hgb urine dipstick: NEGATIVE
Nitrite: NEGATIVE
Specific Gravity, Urine: 1.007 (ref 1.005–1.030)
Urobilinogen, UA: 0.2 mg/dL (ref 0.0–1.0)

## 2012-03-23 LAB — APTT: aPTT: 31 seconds (ref 24–37)

## 2012-03-23 LAB — CBC
HCT: 41 % (ref 39.0–52.0)
MCHC: 34.1 g/dL (ref 30.0–36.0)
RDW: 13.7 % (ref 11.5–15.5)

## 2012-03-23 LAB — COMPREHENSIVE METABOLIC PANEL
Albumin: 3.8 g/dL (ref 3.5–5.2)
Alkaline Phosphatase: 69 U/L (ref 39–117)
BUN: 16 mg/dL (ref 6–23)
Potassium: 4.3 mEq/L (ref 3.5–5.1)
Total Protein: 7.6 g/dL (ref 6.0–8.3)

## 2012-03-23 LAB — PROTIME-INR
INR: 1.02 (ref 0.00–1.49)
Prothrombin Time: 13.3 seconds (ref 11.6–15.2)

## 2012-03-23 NOTE — Progress Notes (Signed)
03/23/12 1325  OBSTRUCTIVE SLEEP APNEA  Have you ever been diagnosed with sleep apnea through a sleep study? No  Do you snore loudly (loud enough to be heard through closed doors)?  0  Do you often feel tired, fatigued, or sleepy during the daytime? 0  Has anyone observed you stop breathing during your sleep? 0  Do you have, or are you being treated for high blood pressure? 1  BMI more than 35 kg/m2? 1  Age over 72 years old? 1  Neck circumference greater than 40 cm/18 inches? 0  Gender: 1  Obstructive Sleep Apnea Score 4

## 2012-03-23 NOTE — Progress Notes (Signed)
Urinalysis with micro results faxed to Dr Lequita Halt via [facility].

## 2012-03-23 NOTE — Progress Notes (Signed)
Requested EKG from office of Dr Felipa Eth.

## 2012-03-28 ENCOUNTER — Other Ambulatory Visit: Payer: Self-pay | Admitting: Orthopedic Surgery

## 2012-03-28 NOTE — H&P (Signed)
Shawn Keller  DOB: Jun 05, 1940 Married / Language: English / Race: Undefined Male  Date of Admission:  03/29/2012  Chief Complaint:  Right Knee Pain  History of Present Illness The patient is a 71 year old male who comes in for a preoperative History and Physical. The patient is scheduled for a right total knee arthroplasty to be performed by Dr. Gus Rankin. Aluisio, MD at [facility] on 03/29/2012. The patient is a 71 year old who is now postop following left total knee arthroplasty. He is doing very well with his left knee. The right knee continues to be a problem. He is now ready to proceed with surgery for the right knee. He's extremely pleased with how his left knee has done. Right knee is what is bothering him the most now. He's at a stage where he wants to get that fixed. He has done well with the left knee and now presents for the right knee. They have been treated conservatively in the past for the above stated problem and despite conservative measures, they continue to have progressive pain and severe functional limitations and dysfunction. They have failed non-operative management including home exercise, medications, and injections. It is felt that they would benefit from undergoing total joint replacement. Risks and benefits of the procedure have been discussed with the patient and they elect to proceed with surgery. There are no active contraindications to surgery such as ongoing infection or rapidly progressive neurological disease.  Problem List/Past Medical Hypercholesterolemia / Hyperlipidemia Pneumonia. 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 Deceased, Cerebrovascular Accident. Age 71 Heart Disease. mother Mother. Deceased, Aneurysm. Age 31 Diabetes Mellitus. brother Cancer. brother Osteoarthritis. mother Severe allergy. child Cerebrovascular Accident. father   Social History Post-Surgical Plans. Plan to go Home after surgery Advance Directives. Living Will, Healthcare POA Tobacco use. Former smoker. Quit 35 years ago Alcohol use. Occasional alcohol use. Children. 2 Marital status. Married. Alcohol use. current drinker; drinks wine; only occasionally per week Children. 2 Current work status. working full time Drug/Alcohol Rehab (Currently). 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 4-5 Previously in rehab. no Tobacco / smoke exposure. no Pain Contract. no Living situation. live with spouse Tobacco use. former smoker; smoke(d) 1 pack(s) per day Exercise. Exercises weekly; does gym / weights Marital status. married Illicit drug use. no   Past Surgical History Umbilical Heria Repair. Date: 03/2010. Dr. Jamey Ripa Lithotripsy. Date: 12/2009. Other Surgery. Open inguinal hernia repair, Lithotripsy Tonsillectomy. Date: 1946.  Review of Systems General:Not Present- Chills, Fever, Night Sweats, Fatigue, Weight Gain, Weight Loss and Memory Loss. Skin:Not Present- Hives, Itching, Rash, Eczema and Lesions. HEENT:Not Present- Tinnitus, Headache, Double Vision, Visual Loss, Hearing Loss and Dentures. Respiratory:Not Present- Shortness of breath with exertion, Shortness of breath at rest, Allergies, Coughing up blood and Chronic Cough. Cardiovascular:Not Present- Chest Pain,  Racing/skipping heartbeats, Difficulty Breathing Lying Down, Murmur, Swelling and Palpitations. Gastrointestinal:Not Present- Bloody Stool, Heartburn, Abdominal Pain, Vomiting, Nausea, Constipation, Diarrhea, Difficulty Swallowing, Jaundice  and Loss of appetitie. Male Genitourinary:Present- Urinary frequency and Weak urinary stream. Not Present- Blood in Urine, Discharge, Flank Pain, Incontinence, Painful Urination, Urgency, Urinary Retention and Urinating at Night. Musculoskeletal:Present- Joint Swelling, Joint Pain and Morning Stiffness. Not Present- Muscle Weakness, Muscle Pain, Back Pain and Spasms. Neurological:Not Present- Tremor, Dizziness, Blackout spells, Paralysis, Difficulty with balance and Weakness. Psychiatric:Not Present- Insomnia.   Vitals Weight: 250 lb Height: 72 in Body Surface Area: 2.4 m Body Mass Index: 33.91 kg/m Pulse: 60 (Regular) Resp.: 12 (Unlabored) BP: 170/82 (Sitting, Right Arm, Standard)    Physical Exam The physical exam findings are as follows:   General Mental Status - Alert, cooperative and good historian. General Appearance- pleasant. Not in acute distress. Orientation- Oriented X3. Build & Nutrition- Well nourished and Well developed.   Note: Patient is a 71 year old male with continued knee pain.  Head and Neck Head- normocephalic, atraumatic . Neck Global Assessment- supple. no bruit auscultated on the right and no bruit auscultated on the left.   Eye Pupil- Bilateral- Regular and Round. Note: Patient wears glasses Motion- Bilateral- EOMI.   ENMT EarsNote: Patient wears bilateral hearing aids.   Chest and Lung Exam Auscultation: Breath sounds:- clear at anterior chest wall and - clear at posterior chest wall. Adventitious sounds:- No Adventitious sounds.   Cardiovascular Auscultation:Rhythm- Regular rate and rhythm. Heart Sounds- S1 WNL and S2 WNL. Murmurs & Other Heart  Sounds:Auscultation of the heart reveals - No Murmurs.   Abdomen Palpation/Percussion:Tenderness- Abdomen is non-tender to palpation. Rigidity (guarding)- Abdomen is soft. Auscultation:Auscultation of the abdomen reveals - Bowel sounds normal.   Male Genitourinary  Not done, not pertinent to present illness  Musculoskeletal Evaluation of his hip shows a normal range of motion with no discomfort. His knees show a slight varus deformity on both sides. He does not have an effusion in either knee. He is tender along the medial aspect of both knees.  Range of motion on the right knee is about 5-120 and left knee about 5-125. There is no instability about either knee. Pulse, sensation and motor are intact distally. He walks with an antalgic gait pattern.  RADIOGRAPHS: AP both knees and lateral show that he has advanced arthritis in both knees. Actually a little bit worse on the left than right. He is bone on bone in the medial and patellofemoral compartments of both knees.  Assessment & Plan Osteoarthritis, Knee (715.96) Impression: Right Knee  Note: Patient is for a right total knee replacement by Dr. Lequita Halt.  Plan is to go home.  Signed electronically by Roberts Gaudy, PA-C

## 2012-03-29 ENCOUNTER — Inpatient Hospital Stay (HOSPITAL_COMMUNITY)
Admission: RE | Admit: 2012-03-29 | Discharge: 2012-03-31 | DRG: 470 | Disposition: A | Discharge status: Home Health | Payer: Medicare Other | Source: Ambulatory Visit | Attending: Orthopedic Surgery | Admitting: Orthopedic Surgery

## 2012-03-29 ENCOUNTER — Encounter (HOSPITAL_COMMUNITY)
Admission: RE | Disposition: A | Discharge status: Home Health | Payer: Self-pay | Source: Ambulatory Visit | Attending: Orthopedic Surgery

## 2012-03-29 ENCOUNTER — Encounter (HOSPITAL_COMMUNITY): Payer: Self-pay | Admitting: Anesthesiology

## 2012-03-29 ENCOUNTER — Encounter (HOSPITAL_COMMUNITY): Payer: Self-pay | Admitting: *Deleted

## 2012-03-29 ENCOUNTER — Inpatient Hospital Stay (HOSPITAL_COMMUNITY): Payer: Medicare Other | Admitting: Anesthesiology

## 2012-03-29 DIAGNOSIS — M1712 Unilateral primary osteoarthritis, left knee: Secondary | ICD-10-CM

## 2012-03-29 DIAGNOSIS — Z96659 Presence of unspecified artificial knee joint: Secondary | ICD-10-CM

## 2012-03-29 DIAGNOSIS — M1710 Unilateral primary osteoarthritis, unspecified knee: Principal | ICD-10-CM | POA: Diagnosis present

## 2012-03-29 DIAGNOSIS — D62 Acute posthemorrhagic anemia: Secondary | ICD-10-CM | POA: Diagnosis not present

## 2012-03-29 DIAGNOSIS — I10 Essential (primary) hypertension: Secondary | ICD-10-CM | POA: Diagnosis present

## 2012-03-29 DIAGNOSIS — E871 Hypo-osmolality and hyponatremia: Secondary | ICD-10-CM | POA: Diagnosis not present

## 2012-03-29 HISTORY — PX: TOTAL KNEE ARTHROPLASTY: SHX125

## 2012-03-29 SURGERY — ARTHROPLASTY, KNEE, TOTAL
Anesthesia: Spinal | Site: Knee | Laterality: Right | Wound class: Clean

## 2012-03-29 MED ORDER — LACTATED RINGERS IV SOLN: INTRAVENOUS | Status: DC

## 2012-03-29 MED ORDER — MORPHINE SULFATE 2 MG/ML IJ SOLN
1.0000 mg | INTRAMUSCULAR | Status: DC | PRN
  Administered 2012-03-29: 1 mg via INTRAVENOUS
  Administered 2012-03-29: 2 mg via INTRAVENOUS
  Administered 2012-03-29: 1 mg via INTRAVENOUS
  Administered 2012-03-29 – 2012-03-30 (×4): 2 mg via INTRAVENOUS
  Filled 2012-03-29 (×7): qty 1

## 2012-03-29 MED ORDER — SODIUM CHLORIDE 0.9 % IV SOLN: INTRAVENOUS | Status: DC

## 2012-03-29 MED ORDER — MENTHOL 3 MG MT LOZG: 1.0000 | LOZENGE | OROMUCOSAL | Status: DC | PRN

## 2012-03-29 MED ORDER — TRAMADOL HCL 50 MG PO TABS: 50.0000 mg | ORAL_TABLET | Freq: Four times a day (QID) | ORAL | Status: DC | PRN

## 2012-03-29 MED ORDER — BISACODYL 10 MG RE SUPP: 10.0000 mg | Freq: Every day | RECTAL | Status: DC | PRN

## 2012-03-29 MED ORDER — BUPIVACAINE ON-Q PAIN PUMP (FOR ORDER SET NO CHG)
INJECTION | Status: DC
  Filled 2012-03-29: qty 1

## 2012-03-29 MED ORDER — METOCLOPRAMIDE HCL 10 MG PO TABS: 5.0000 mg | ORAL_TABLET | Freq: Three times a day (TID) | ORAL | Status: DC | PRN

## 2012-03-29 MED ORDER — POLYETHYLENE GLYCOL 3350 17 G PO PACK: 17.0000 g | PACK | Freq: Every day | ORAL | Status: DC | PRN

## 2012-03-29 MED ORDER — FENTANYL CITRATE 0.05 MG/ML IJ SOLN
INTRAMUSCULAR | Status: DC | PRN
  Administered 2012-03-29: 100 ug via INTRAVENOUS

## 2012-03-29 MED ORDER — METHOCARBAMOL 500 MG PO TABS
500.0000 mg | ORAL_TABLET | Freq: Four times a day (QID) | ORAL | Status: DC | PRN
  Administered 2012-03-29 – 2012-03-31 (×3): 500 mg via ORAL
  Filled 2012-03-29 (×3): qty 1

## 2012-03-29 MED ORDER — OXYCODONE HCL 5 MG PO TABS
5.0000 mg | ORAL_TABLET | ORAL | Status: DC | PRN
  Administered 2012-03-29 – 2012-03-31 (×11): 10 mg via ORAL
  Filled 2012-03-29 (×12): qty 2

## 2012-03-29 MED ORDER — PROPOFOL 10 MG/ML IV EMUL
INTRAVENOUS | Status: DC | PRN
  Administered 2012-03-29: 50 ug/kg/min via INTRAVENOUS

## 2012-03-29 MED ORDER — ACETAMINOPHEN 325 MG PO TABS: 650.0000 mg | ORAL_TABLET | Freq: Four times a day (QID) | ORAL | Status: DC | PRN

## 2012-03-29 MED ORDER — 0.9 % SODIUM CHLORIDE (POUR BTL) OPTIME
TOPICAL | Status: DC | PRN
  Administered 2012-03-29: 1000 mL

## 2012-03-29 MED ORDER — MIDAZOLAM HCL 5 MG/5ML IJ SOLN
INTRAMUSCULAR | Status: DC | PRN
Start: 2012-03-29 — End: 2012-03-29
  Administered 2012-03-29: 2 mg via INTRAVENOUS

## 2012-03-29 MED ORDER — CEFAZOLIN SODIUM-DEXTROSE 2-3 GM-% IV SOLR
2.0000 g | Freq: Four times a day (QID) | INTRAVENOUS | Status: AC
  Administered 2012-03-29 (×2): 2 g via INTRAVENOUS
  Filled 2012-03-29 (×2): qty 50

## 2012-03-29 MED ORDER — METOCLOPRAMIDE HCL 5 MG/ML IJ SOLN: 5.0000 mg | Freq: Three times a day (TID) | INTRAMUSCULAR | Status: DC | PRN

## 2012-03-29 MED ORDER — DOCUSATE SODIUM 100 MG PO CAPS
100.0000 mg | ORAL_CAPSULE | Freq: Two times a day (BID) | ORAL | Status: DC
  Administered 2012-03-29 – 2012-03-31 (×4): 100 mg via ORAL

## 2012-03-29 MED ORDER — DEXTROSE 5 % IV SOLN
3.0000 g | INTRAVENOUS | Status: AC
  Administered 2012-03-29: 3 g via INTRAVENOUS
  Filled 2012-03-29: qty 3000

## 2012-03-29 MED ORDER — ONDANSETRON HCL 4 MG PO TABS: 4.0000 mg | ORAL_TABLET | Freq: Four times a day (QID) | ORAL | Status: DC | PRN

## 2012-03-29 MED ORDER — ACETAMINOPHEN 650 MG RE SUPP: 650.0000 mg | Freq: Four times a day (QID) | RECTAL | Status: DC | PRN

## 2012-03-29 MED ORDER — RIVAROXABAN 10 MG PO TABS
10.0000 mg | ORAL_TABLET | Freq: Every day | ORAL | Status: DC
  Administered 2012-03-30 – 2012-03-31 (×2): 10 mg via ORAL
  Filled 2012-03-29 (×3): qty 1

## 2012-03-29 MED ORDER — LACTATED RINGERS IV SOLN
INTRAVENOUS | Status: DC | PRN
  Administered 2012-03-29 (×3): via INTRAVENOUS

## 2012-03-29 MED ORDER — ACETAMINOPHEN 10 MG/ML IV SOLN: 1000.0000 mg | Freq: Once | INTRAVENOUS | Status: DC

## 2012-03-29 MED ORDER — SODIUM CHLORIDE 0.9 % IR SOLN
Status: DC | PRN
  Administered 2012-03-29: 1000 mL

## 2012-03-29 MED ORDER — ONDANSETRON HCL 4 MG/2ML IJ SOLN
4.0000 mg | Freq: Four times a day (QID) | INTRAMUSCULAR | Status: DC | PRN
  Administered 2012-03-29: 4 mg via INTRAVENOUS
  Filled 2012-03-29: qty 2

## 2012-03-29 MED ORDER — DEXAMETHASONE SODIUM PHOSPHATE 10 MG/ML IJ SOLN
10.0000 mg | Freq: Once | INTRAMUSCULAR | Status: AC
  Filled 2012-03-29: qty 1

## 2012-03-29 MED ORDER — DIPHENHYDRAMINE HCL 12.5 MG/5ML PO ELIX
12.5000 mg | ORAL_SOLUTION | ORAL | Status: DC | PRN
Start: 2012-03-29 — End: 2012-03-31

## 2012-03-29 MED ORDER — HYDROMORPHONE HCL PF 1 MG/ML IJ SOLN: 0.2500 mg | INTRAMUSCULAR | Status: DC | PRN

## 2012-03-29 MED ORDER — PHENOL 1.4 % MT LIQD: 1.0000 | OROMUCOSAL | Status: DC | PRN

## 2012-03-29 MED ORDER — BUPIVACAINE 0.25 % ON-Q PUMP SINGLE CATH 300ML
INJECTION | Status: DC | PRN
  Administered 2012-03-29: 300 mL

## 2012-03-29 MED ORDER — FLEET ENEMA 7-19 GM/118ML RE ENEM: 1.0000 | ENEMA | Freq: Once | RECTAL | Status: AC | PRN

## 2012-03-29 MED ORDER — METHOCARBAMOL 100 MG/ML IJ SOLN
500.0000 mg | Freq: Four times a day (QID) | INTRAVENOUS | Status: DC | PRN
  Administered 2012-03-29: 500 mg via INTRAVENOUS
  Filled 2012-03-29: qty 5

## 2012-03-29 MED ORDER — DEXAMETHASONE 6 MG PO TABS
10.0000 mg | ORAL_TABLET | Freq: Once | ORAL | Status: AC
  Administered 2012-03-30: 10 mg via ORAL
  Filled 2012-03-29: qty 1

## 2012-03-29 MED ORDER — CHLORHEXIDINE GLUCONATE 4 % EX LIQD
60.0000 mL | Freq: Once | CUTANEOUS | Status: DC
  Filled 2012-03-29: qty 60

## 2012-03-29 MED ORDER — ACETAMINOPHEN 10 MG/ML IV SOLN
INTRAVENOUS | Status: DC | PRN
  Administered 2012-03-29: 1000 mg via INTRAVENOUS

## 2012-03-29 MED ORDER — ACETAMINOPHEN 10 MG/ML IV SOLN
1000.0000 mg | Freq: Four times a day (QID) | INTRAVENOUS | Status: AC
  Administered 2012-03-29 – 2012-03-30 (×4): 1000 mg via INTRAVENOUS
  Filled 2012-03-29 (×7): qty 100

## 2012-03-29 MED ORDER — ONDANSETRON HCL 4 MG/2ML IJ SOLN
INTRAMUSCULAR | Status: DC | PRN
  Administered 2012-03-29: 4 mg via INTRAVENOUS

## 2012-03-29 MED ORDER — DEXTROSE-NACL 5-0.9 % IV SOLN
INTRAVENOUS | Status: DC
  Administered 2012-03-29 – 2012-03-30 (×3): via INTRAVENOUS

## 2012-03-29 MED ORDER — CARVEDILOL 12.5 MG PO TABS
12.5000 mg | ORAL_TABLET | Freq: Two times a day (BID) | ORAL | Status: DC
  Administered 2012-03-29 – 2012-03-31 (×4): 12.5 mg via ORAL
  Filled 2012-03-29 (×6): qty 1

## 2012-03-29 SURGICAL SUPPLY — 54 items
BAG SPEC THK2 15X12 ZIP CLS (MISCELLANEOUS) ×1
BAG ZIPLOCK 12X15 (MISCELLANEOUS) ×2 IMPLANT
BANDAGE ELASTIC 6 VELCRO ST LF (GAUZE/BANDAGES/DRESSINGS) ×2 IMPLANT
BANDAGE ESMARK 6X9 LF (GAUZE/BANDAGES/DRESSINGS) ×1 IMPLANT
BLADE SAG 18X100X1.27 (BLADE) ×2 IMPLANT
BLADE SAW SGTL 11.0X1.19X90.0M (BLADE) ×2 IMPLANT
BNDG CMPR 9X6 STRL LF SNTH (GAUZE/BANDAGES/DRESSINGS) ×1
BNDG ESMARK 6X9 LF (GAUZE/BANDAGES/DRESSINGS) ×2
BOWL SMART MIX CTS (DISPOSABLE) ×2 IMPLANT
CATH KIT ON-Q SILVERSOAK 5IN (CATHETERS) ×2 IMPLANT
CEMENT HV SMART SET (Cement) ×2 IMPLANT
CLOTH BEACON ORANGE TIMEOUT ST (SAFETY) ×2 IMPLANT
CLSR STERI-STRIP ANTIMIC 1/2X4 (GAUZE/BANDAGES/DRESSINGS) ×2 IMPLANT
CUFF TOURN SGL QUICK 34 (TOURNIQUET CUFF) ×2
CUFF TRNQT CYL 34X4X40X1 (TOURNIQUET CUFF) ×1 IMPLANT
DRAPE EXTREMITY T 121X128X90 (DRAPE) ×2 IMPLANT
DRAPE POUCH INSTRU U-SHP 10X18 (DRAPES) ×2 IMPLANT
DRAPE U-SHAPE 47X51 STRL (DRAPES) ×2 IMPLANT
DRSG ADAPTIC 3X8 NADH LF (GAUZE/BANDAGES/DRESSINGS) ×2 IMPLANT
DRSG PAD ABDOMINAL 8X10 ST (GAUZE/BANDAGES/DRESSINGS) ×2 IMPLANT
DURAPREP 26ML APPLICATOR (WOUND CARE) ×2 IMPLANT
ELECT REM PT RETURN 9FT ADLT (ELECTROSURGICAL) ×2
ELECTRODE REM PT RTRN 9FT ADLT (ELECTROSURGICAL) ×1 IMPLANT
EVACUATOR 1/8 PVC DRAIN (DRAIN) ×2 IMPLANT
FACESHIELD LNG OPTICON STERILE (SAFETY) ×10 IMPLANT
GLOVE BIO SURGEON STRL SZ8 (GLOVE) ×2 IMPLANT
GLOVE BIOGEL PI IND STRL 8 (GLOVE) ×2 IMPLANT
GLOVE BIOGEL PI INDICATOR 8 (GLOVE) ×2
GLOVE ECLIPSE 8.0 STRL XLNG CF (GLOVE) ×2 IMPLANT
GLOVE SURG SS PI 6.5 STRL IVOR (GLOVE) ×4 IMPLANT
GOWN STRL NON-REIN LRG LVL3 (GOWN DISPOSABLE) ×4 IMPLANT
GOWN STRL REIN XL XLG (GOWN DISPOSABLE) ×2 IMPLANT
HANDPIECE INTERPULSE COAX TIP (DISPOSABLE) ×2
IMMOBILIZER KNEE 20 (SOFTGOODS) ×2
IMMOBILIZER KNEE 20 THIGH 36 (SOFTGOODS) ×1 IMPLANT
KIT BASIN OR (CUSTOM PROCEDURE TRAY) ×2 IMPLANT
MANIFOLD NEPTUNE II (INSTRUMENTS) ×2 IMPLANT
NS IRRIG 1000ML POUR BTL (IV SOLUTION) ×2 IMPLANT
PACK TOTAL JOINT (CUSTOM PROCEDURE TRAY) ×2 IMPLANT
PAD ABD 7.5X8 STRL (GAUZE/BANDAGES/DRESSINGS) ×2 IMPLANT
PADDING CAST COTTON 6X4 STRL (CAST SUPPLIES) ×2 IMPLANT
POSITIONER SURGICAL ARM (MISCELLANEOUS) ×2 IMPLANT
SET HNDPC FAN SPRY TIP SCT (DISPOSABLE) ×1 IMPLANT
SPONGE GAUZE 4X4 12PLY (GAUZE/BANDAGES/DRESSINGS) ×2 IMPLANT
STRIP CLOSURE SKIN 1/2X4 (GAUZE/BANDAGES/DRESSINGS) ×4 IMPLANT
SUCTION FRAZIER 12FR DISP (SUCTIONS) ×2 IMPLANT
SUT MNCRL AB 4-0 PS2 18 (SUTURE) ×2 IMPLANT
SUT VIC AB 2-0 CT1 27 (SUTURE) ×8
SUT VIC AB 2-0 CT1 TAPERPNT 27 (SUTURE) ×4 IMPLANT
SUT VLOC 180 0 24IN GS25 (SUTURE) ×2 IMPLANT
TOWEL OR 17X26 10 PK STRL BLUE (TOWEL DISPOSABLE) ×4 IMPLANT
TRAY FOLEY CATH 14FRSI W/METER (CATHETERS) ×2 IMPLANT
WATER STERILE IRR 1500ML POUR (IV SOLUTION) ×2 IMPLANT
WRAP KNEE MAXI GEL POST OP (GAUZE/BANDAGES/DRESSINGS) ×4 IMPLANT

## 2012-03-29 NOTE — Anesthesia Postprocedure Evaluation (Signed)
  Anesthesia Post-op Note  Patient: Shawn Keller  Procedure(s) Performed: Procedure(s) (LRB): TOTAL KNEE ARTHROPLASTY (Right)  Patient Location: PACU  Anesthesia Type: Spinal  Level of Consciousness: awake and alert   Airway and Oxygen Therapy: Patient Spontanous Breathing  Post-op Pain: mild  Post-op Assessment: Post-op Vital signs reviewed, Patient's Cardiovascular Status Stable, Respiratory Function Stable, Patent Airway and No signs of Nausea or vomiting  Post-op Vital Signs: stable  Complications: No apparent anesthesia complications

## 2012-03-29 NOTE — Progress Notes (Signed)
PT Note  Pt in too much pain for POD 0 evaluation per RN.  Will check back tomorrow.  Zenovia Jarred, PT Pager: [phone]

## 2012-03-29 NOTE — Plan of Care (Signed)
Problem: Consults Goal: Diagnosis- Total Joint Replacement Right total knee     

## 2012-03-29 NOTE — Progress Notes (Signed)
Utilization review completed.  

## 2012-03-29 NOTE — Anesthesia Procedure Notes (Signed)
Spinal  Patient location during procedure: OR End time: 03/29/2012 8:06 AM Staffing CRNA/Resident: Enriqueta Shutter Performed by: anesthesiologist and resident/CRNA  Preanesthetic Checklist Completed: patient identified, site marked, surgical consent, pre-op evaluation, timeout performed, IV checked, risks and benefits discussed and monitors and equipment checked Spinal Block Patient position: sitting Prep: Betadine Patient monitoring: heart rate, continuous pulse ox and blood pressure Approach: midline Location: L3-4 Injection technique: single-shot Needle Needle type: Spinocan, Pencil-Tip and Sprotte  Needle gauge: 24 G Needle length: 9 cm Assessment Sensory level: T6 Additional Notes Expiration date of kit checked and confirmed. Patient tolerated procedure well, without complications.

## 2012-03-29 NOTE — H&P (View-Only) (Signed)
Shawn Keller  DOB: 09/23/1940 Married / Language: English / Race: Undefined Male  Date of Admission:  03/29/2012  Chief Complaint:  Right Knee Pain  History of Present Illness The patient is a 71 year old male who comes in for a preoperative History and Physical. The patient is scheduled for a right total knee arthroplasty to be performed by Dr. Frank V. Aluisio, MD at [facility] on 03/29/2012. The patient is a 71 year old who is now postop following left total knee arthroplasty. He is doing very well with his left knee. The right knee continues to be a problem. He is now ready to proceed with surgery for the right knee. He's extremely pleased with how his left knee has done. Right knee is what is bothering him the most now. He's at a stage where he wants to get that fixed. He has done well with the left knee and now presents for the right knee. They have been treated conservatively in the past for the above stated problem and despite conservative measures, they continue to have progressive pain and severe functional limitations and dysfunction. They have failed non-operative management including home exercise, medications, and injections. It is felt that they would benefit from undergoing total joint replacement. Risks and benefits of the procedure have been discussed with the patient and they elect to proceed with surgery. There are no active contraindications to surgery such as ongoing infection or rapidly progressive neurological disease.  Problem List/Past Medical Hypercholesterolemia / Hyperlipidemia Pneumonia. 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 Deceased, Cerebrovascular Accident. Age 85 Heart Disease. mother Mother. Deceased, Aneurysm. Age 77 Diabetes Mellitus. brother Cancer. brother Osteoarthritis. mother Severe allergy. child Cerebrovascular Accident. father   Social History Post-Surgical Plans. Plan to go Home after surgery Advance Directives. Living Will, Healthcare POA Tobacco use. Former smoker. Quit 35 years ago Alcohol use. Occasional alcohol use. Children. 2 Marital status. Married. Alcohol use. current drinker; drinks wine; only occasionally per week Children. 2 Current work status. working full time Drug/Alcohol Rehab (Currently). 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 no Tobacco / smoke exposure. no Pain Contract. no Living situation. live with spouse Tobacco use. former smoker; smoke(d) 1 pack(s) per day Exercise. Exercises weekly; does gym / weights Marital status. married Illicit drug use. no   Past Surgical History Umbilical Heria Repair. Date: 03/2010. Dr. Streck Lithotripsy. Date: 12/2009. Other Surgery. Open inguinal hernia repair, Lithotripsy Tonsillectomy. Date: 1946.  Review of Systems General:Not Present- Chills, Fever, Night Sweats, Fatigue, Weight Gain, Weight Loss and Memory Loss. Skin:Not Present- Hives, Itching, Rash, Eczema and Lesions. HEENT:Not Present- Tinnitus, Headache, Double Vision, Visual Loss, Hearing Loss and Dentures. Respiratory:Not Present- Shortness of breath with exertion, Shortness of breath at rest, Allergies, Coughing up blood and Chronic Cough. Cardiovascular:Not Present- Chest Pain,  Racing/skipping heartbeats, Difficulty Breathing Lying Down, Murmur, Swelling and Palpitations. Gastrointestinal:Not Present- Bloody Stool, Heartburn, Abdominal Pain, Vomiting, Nausea, Constipation, Diarrhea, Difficulty Swallowing, Jaundice   and Loss of appetitie. Male Genitourinary:Present- Urinary frequency and Weak urinary stream. Not Present- Blood in Urine, Discharge, Flank Pain, Incontinence, Painful Urination, Urgency, Urinary Retention and Urinating at Night. Musculoskeletal:Present- Joint Swelling, Joint Pain and Morning Stiffness. Not Present- Muscle Weakness, Muscle Pain, Back Pain and Spasms. Neurological:Not Present- Tremor, Dizziness, Blackout spells, Paralysis, Difficulty with balance and Weakness. Psychiatric:Not Present- Insomnia.   Vitals Weight: 250 lb Height: 72 in Body Surface Area: 2.4 m Body Mass Index: 33.91 kg/m Pulse: 60 (Regular) Resp.: 12 (Unlabored) BP: 170/82 (Sitting, Right Arm, Standard)    Physical Exam The physical exam findings are as follows:   General Mental Status - Alert, cooperative and good historian. General Appearance- pleasant. Not in acute distress. Orientation- Oriented X3. Build & Nutrition- Well nourished and Well developed.   Note: Patient is a 71 year old male with continued knee pain.  Head and Neck Head- normocephalic, atraumatic . Neck Global Assessment- supple. no bruit auscultated on the right and no bruit auscultated on the left.   Eye Pupil- Bilateral- Regular and Round. Note: Patient wears glasses Motion- Bilateral- EOMI.   ENMT EarsNote: Patient wears bilateral hearing aids.   Chest and Lung Exam Auscultation: Breath sounds:- clear at anterior chest wall and - clear at posterior chest wall. Adventitious sounds:- No Adventitious sounds.   Cardiovascular Auscultation:Rhythm- Regular rate and rhythm. Heart Sounds- S1 WNL and S2 WNL. Murmurs & Other Heart  Sounds:Auscultation of the heart reveals - No Murmurs.   Abdomen Palpation/Percussion:Tenderness- Abdomen is non-tender to palpation. Rigidity (guarding)- Abdomen is soft. Auscultation:Auscultation of the abdomen reveals - Bowel sounds normal.   Male Genitourinary  Not done, not pertinent to present illness  Musculoskeletal Evaluation of his hip shows a normal range of motion with no discomfort. His knees show a slight varus deformity on both sides. He does not have an effusion in either knee. He is tender along the medial aspect of both knees.  Range of motion on the right knee is about 5-120 and left knee about 5-125. There is no instability about either knee. Pulse, sensation and motor are intact distally. He walks with an antalgic gait pattern.  RADIOGRAPHS: AP both knees and lateral show that he has advanced arthritis in both knees. Actually a little bit worse on the left than right. He is bone on bone in the medial and patellofemoral compartments of both knees.  Assessment & Plan Osteoarthritis, Knee (715.96) Impression: Right Knee  Note: Patient is for a right total knee replacement by Dr. Aluisio.  Plan is to go home.  Signed electronically by DREW L Karista Aispuro, PA-C  

## 2012-03-29 NOTE — Anesthesia Preprocedure Evaluation (Signed)
Anesthesia Evaluation  Patient identified by MRN, date of birth, ID band Patient awake    Reviewed: Allergy & Precautions, H&P , NPO status , Patient's Chart, lab work & pertinent test results, reviewed documented beta blocker date and time   Airway Mallampati: II TM Distance: >3 FB Neck ROM: full    Dental No notable dental hx. (+) Teeth Intact and Dental Advisory Given   Pulmonary neg pulmonary ROS,  breath sounds clear to auscultation  Pulmonary exam normal       Cardiovascular Exercise Tolerance: Good hypertension, Pt. on home beta blockers negative cardio ROS  Rhythm:regular Rate:Normal     Neuro/Psych negative neurological ROS  negative psych ROS   GI/Hepatic negative GI ROS, Neg liver ROS,   Endo/Other  negative endocrine ROS  Renal/GU negative Renal ROS  negative genitourinary   Musculoskeletal   Abdominal   Peds  Hematology negative hematology ROS (+)   Anesthesia Other Findings   Reproductive/Obstetrics negative OB ROS                           Anesthesia Physical Anesthesia Plan  ASA: II  Anesthesia Plan: Spinal   Post-op Pain Management:    Induction:   Airway Management Planned:   Additional Equipment:   Intra-op Plan:   Post-operative Plan:   Informed Consent: I have reviewed the patients History and Physical, chart, labs and discussed the procedure including the risks, benefits and alternatives for the proposed anesthesia with the patient or authorized representative who has indicated his/her understanding and acceptance.   Dental Advisory Given  Plan Discussed with: CRNA and Surgeon  Anesthesia Plan Comments:         Anesthesia Quick Evaluation

## 2012-03-29 NOTE — Op Note (Signed)
Pre-operative diagnosis- Osteoarthritis  Right knee(s)  Post-operative diagnosis- Osteoarthritis Right knee(s)  Procedure-  Right  Total Knee Arthroplasty  Surgeon- Gus Rankin. Neesa Knapik, MD  Assistant- Dimitri Ped, PA-C   Anesthesia-  Spinal EBL-* No blood loss amount entered *  Drains Hemovac  Tourniquet time-  Total Tourniquet Time Documented: Thigh (Right) - 44 minutes   Complications- None  Condition-PACU - hemodynamically stable.   Brief Clinical Note  Shawn Keller is a 71 y.o. year old male with end stage OA of his right knee with progressively worsening pain and dysfunction. He has constant pain, with activity and at rest and significant functional deficits with difficulties even with ADLs. He has had extensive non-op management including analgesics, injections of cortisone and viscosupplements, and home exercise program, but remains in significant pain with significant dysfunction. Radiographs show bone on bone arthritis medial and patellofemoral with tibial subluxation. He presents now for right Total Knee Arthroplasty.    Procedure in detail---   The patient is brought into the operating room and positioned supine on the operating table. After successful administration of  Spinal,   a tourniquet is placed high on the  Right thigh(s) and the lower extremity is prepped and draped in the usual sterile fashion. Time out is performed by the operating team and then the  Right lower extremity is wrapped in Esmarch, knee flexed and the tourniquet inflated to 300 mmHg.       A midline incision is made with a ten blade through the subcutaneous tissue to the level of the extensor mechanism. A fresh blade is used to make a medial parapatellar arthrotomy. Soft tissue over the proximal medial tibia is subperiosteally elevated to the joint line with a knife and into the semimembranosus bursa with a Cobb elevator. Soft tissue over the proximal lateral tibia is elevated with attention being  paid to avoiding the patellar tendon on the tibial tubercle. The patella is everted, knee flexed 90 degrees and the ACL and PCL are removed. Findings are bone on bone medial and patellofemoral with large medial osteophytes.        The drill is used to create a starting hole in the distal femur and the canal is thoroughly irrigated with sterile saline to remove the fatty contents. The 5 degree Right  valgus alignment guide is placed into the femoral canal and the distal femoral cutting block is pinned to remove 10 mm off the distal femur. Resection is made with an oscillating saw.      The tibia is subluxed forward and the menisci are removed. The extramedullary alignment guide is placed referencing proximally at the medial aspect of the tibial tubercle and distally along the second metatarsal axis and tibial crest. The block is pinned to remove 2mm off the more deficient medial  side. Resection is made with an oscillating saw. Size 5is the most appropriate size for the tibia and the proximal tibia is prepared with the modular drill and keel punch for that size.      The femoral sizing guide is placed and size 5 is most appropriate. Rotation is marked off the epicondylar axis and confirmed by creating a rectangular flexion gap at 90 degrees. The size 5 cutting block is pinned in this rotation and the anterior, posterior and chamfer cuts are made with the oscillating saw. The intercondylar block is then placed and that cut is made.      Trial size 5 tibial component, trial size 5 posterior stabilized femur and  a 12.5  mm posterior stabilized rotating platform insert trial is placed. Full extension is achieved with excellent varus/valgus and anterior/posterior balance throughout full range of motion. The patella is everted and thickness measured to be 27  mm. Free hand resection is taken to 15 mm, a 41 template is placed, lug holes are drilled, trial patella is placed, and it tracks normally. Osteophytes are  removed off the posterior femur with the trial in place. All trials are removed and the cut bone surfaces prepared with pulsatile lavage. Cement is mixed and once ready for implantation, the size 5 tibial implant, size  5 posterior stabilized femoral component, and the size 41 patella are cemented in place and the patella is held with the clamp. The trial insert is placed and the knee held in full extension. All extruded cement is removed and once the cement is hard the permanent 12.5 mm posterior stabilized rotating platform insert is placed into the tibial tray.      The wound is copiously irrigated with saline solution and the extensor mechanism closed over a hemovac drain with #1 PDS suture. The tourniquet is released for a total tourniquet time of 44  minutes. Flexion against gravity is 140 degrees and the patella tracks normally. Subcutaneous tissue is closed with 2.0 vicryl and subcuticular with running 4.0 Monocryl. The catheter for the Marcaine pain pump is placed and the pump is initiated. The incision is cleaned and dried and steri-strips and a bulky sterile dressing are applied. The limb is placed into a knee immobilizer and the patient is awakened and transported to recovery in stable condition.      Please note that a surgical assistant was a medical necessity for this procedure in order to perform it in a safe and expeditious manner. Surgical assistant was necessary to retract the ligaments and vital neurovascular structures to prevent injury to them and also necessary for proper positioning of the limb to allow for anatomic placement of the prosthesis.   Gus Rankin Jarry Manon, MD    03/29/2012, 9:15 AM

## 2012-03-29 NOTE — Transfer of Care (Signed)
Immediate Anesthesia Transfer of Care Note  Patient: Shawn Keller  Procedure(s) Performed: Procedure(s) (LRB) with comments: TOTAL KNEE ARTHROPLASTY (Right)  Patient Location: PACU  Anesthesia Type:Regional  Level of Consciousness: awake, alert  and oriented  Airway & Oxygen Therapy: Patient Spontanous Breathing and Patient connected to face mask oxygen  Post-op Assessment: Report given to PACU RN and Post -op Vital signs reviewed and stable  Post vital signs: Reviewed and stable  Complications: No apparent anesthesia complications

## 2012-03-29 NOTE — Interval H&P Note (Signed)
History and Physical Interval Note:  03/29/2012 6:50 AM  Shawn Keller  has presented today for surgery, with the diagnosis of osteoarthritis right knee  The various methods of treatment have been discussed with the patient and family. After consideration of risks, benefits and other options for treatment, the patient has consented to  Procedure(s) (LRB) with comments: TOTAL KNEE ARTHROPLASTY (Right) as a surgical intervention .  The patient's history has been reviewed, patient examined, no change in status, stable for surgery.  I have reviewed the patient's chart and labs.  Questions were answered to the patient's satisfaction.     Loanne Drilling

## 2012-03-29 NOTE — Evaluation (Signed)
Physical Therapy Evaluation Patient Details Name: Shawn Keller MRN: [id number] DOB: 1940/07/23 Today's Date: 03/29/2012 Time: 1478-2956 PT Time Calculation (min): 24 min  PT Assessment / Plan / Recommendation Clinical Impression  Pt presents s/p R TKA POD 0 with decreased strength, ROM and mobility.  Has history of L TKA last December.  Tolerated sitting EOB and was able to take some steps from bed to chair, however became nauseated, therefore further ambulation deferred.  Pt will benefit from skilled PT in acute venue to address deficits.  PT recommends HHPT for follow up at D/C to maximize pts independence.     PT Assessment  Patient needs continued PT services    Follow Up Recommendations  Home health PT    Does the patient have the potential to tolerate intense rehabilitation      Barriers to Discharge None      Equipment Recommendations  None recommended by PT    Recommendations for Other Services OT consult   Frequency 7X/week    Precautions / Restrictions Precautions Precautions: Knee Required Braces or Orthoses: Knee Immobilizer - Right Knee Immobilizer - Right: Discontinue once straight leg raise with < 10 degree lag Restrictions Weight Bearing Restrictions: No Other Position/Activity Restrictions: WBAT   Pertinent Vitals/Pain 5/10 pain, nausea when up, RN aware.      Mobility  Bed Mobility Bed Mobility: Supine to Sit;Sitting - Scoot to Edge of Bed Supine to Sit: 4: Min assist;HOB elevated;With rails Sitting - Scoot to Edge of Bed: 4: Min assist Details for Bed Mobility Assistance: Assist for RLE out of bed with cues for hand placement on bed to self assist.   Transfers Transfers: Sit to Stand;Stand to Sit;Stand Pivot Transfers Sit to Stand: 4: Min assist;From elevated surface;With upper extremity assist;From bed Stand to Sit: 4: Min assist;With upper extremity assist;With armrests;To chair/3-in-1 Details for Transfer Assistance: Assist to rise and  stabalize with cues for hand placement and safety.  Able to take some steps from bed to chair with cues for sequencing/technique with RW.  Deferred further ambulation due to nausea.  Ambulation/Gait Assistive device: Rolling walker Stairs: No Wheelchair Mobility Wheelchair Mobility: No    Shoulder Instructions     Exercises     PT Diagnosis: Difficulty walking;Acute pain  PT Problem List: Decreased strength;Decreased range of motion;Decreased activity tolerance;Decreased mobility;Decreased balance;Decreased knowledge of use of DME;Pain;Obesity PT Treatment Interventions: DME instruction;Gait training;Stair training;Functional mobility training;Therapeutic activities;Therapeutic exercise;Balance training;Patient/family education   PT Goals Acute Rehab PT Goals PT Goal Formulation: With patient Time For Goal Achievement: 04/02/12 Potential to Achieve Goals: Good Pt will go Supine/Side to Sit: with supervision PT Goal: Supine/Side to Sit - Progress: Goal set today Pt will go Sit to Supine/Side: with supervision PT Goal: Sit to Supine/Side - Progress: Goal set today Pt will go Sit to Stand: with supervision PT Goal: Sit to Stand - Progress: Goal set today Pt will go Stand to Sit: with supervision PT Goal: Stand to Sit - Progress: Goal set today Pt will Ambulate: 51 - 150 feet;with supervision;with least restrictive assistive device PT Goal: Ambulate - Progress: Goal set today Pt will Go Up / Down Stairs: 1-2 stairs;with supervision;with least restrictive assistive device PT Goal: Up/Down Stairs - Progress: Goal set today  Visit Information  Last PT Received On: 03/29/12 Assistance Needed: +1    Subjective Data  Subjective: Lets get this over with then I'll get my meds.  Patient Stated Goal: to return home   Prior Functioning  Home Living  Lives With: Spouse Type of Home: House Home Access: Stairs to enter [employer] of Steps: 2 Home Layout: One level Bathroom  Shower/Tub: Engineer, manufacturing systems: Standard Home Adaptive Equipment: Walker - rolling;Straight cane;Bedside commode/3-in-1 Prior Function Level of Independence: Independent Able to Take Stairs?: Yes Driving: Yes Communication Communication: No difficulties    Cognition  Overall Cognitive Status: Appears within functional limits for tasks assessed/performed Arousal/Alertness: Awake/alert Orientation Level: Appears intact for tasks assessed Behavior During Session: [facility] for tasks performed    Extremity/Trunk Assessment Right Lower Extremity Assessment RLE ROM/Strength/Tone: Deficits RLE ROM/Strength/Tone Deficits: ankle motions WFL, able to initiate SLR, however required assist against gravity.  RLE Sensation: WFL - Light Touch Left Lower Extremity Assessment LLE ROM/Strength/Tone: WFL for tasks assessed LLE Sensation: WFL - Light Touch Trunk Assessment Trunk Assessment: Normal   Balance    End of Session PT - End of Session Equipment Utilized During Treatment: Gait belt;Right knee immobilizer Activity Tolerance: Other (comment) (Limited by nausea) Patient left: in chair;with call bell/phone within reach;with nursing in room Nurse Communication: Mobility status  GP     Page, Meribeth Mattes 03/29/2012, 5:04 PM

## 2012-03-30 ENCOUNTER — Encounter (HOSPITAL_COMMUNITY): Payer: Self-pay | Admitting: Orthopedic Surgery

## 2012-03-30 DIAGNOSIS — D62 Acute posthemorrhagic anemia: Secondary | ICD-10-CM | POA: Diagnosis not present

## 2012-03-30 LAB — CBC
MCH: 30.4 pg (ref 26.0–34.0)
MCV: 91 fL (ref 78.0–100.0)
Platelets: 165 10*3/uL (ref 150–400)
RBC: 3.55 MIL/uL — ABNORMAL LOW (ref 4.22–5.81)
RDW: 13.6 % (ref 11.5–15.5)
WBC: 7.5 10*3/uL (ref 4.0–10.5)

## 2012-03-30 LAB — BASIC METABOLIC PANEL
CO2: 28 mEq/L (ref 19–32)
Calcium: 8 mg/dL — ABNORMAL LOW (ref 8.4–10.5)
Chloride: 103 mEq/L (ref 96–112)
Creatinine, Ser: 1.22 mg/dL (ref 0.50–1.35)
GFR calc Af Amer: 67 mL/min — ABNORMAL LOW (ref >90)
Sodium: 136 mEq/L (ref 135–145)

## 2012-03-30 NOTE — Progress Notes (Signed)
SCIP - Anticoagulation  Xarelto due @ 0800, AET was 0947.  Pharmacy spoke with Danna (RN) and made her aware of tight window to achieve SCIP measure.  Xarelto dose available on floor.  Thanks  Geoffry Paradise, PharmD, BCPS Pager: [phone] 7:47 AM Pharmacy #: [phone]

## 2012-03-30 NOTE — Progress Notes (Signed)
   Subjective: 1 Day Post-Op Procedure(s) (LRB): TOTAL KNEE ARTHROPLASTY (Right) Patient reports pain as mild.   Patient seen in rounds with Dr. Lequita Halt. Did get up to chair yesterday. Patient is well, and has had no acute complaints or problems We will start therapy today.  Plan is to go Home after hospital stay.  Objective: Vital signs in last 24 hours: Temp:  [97.4 F (36.3 C)-99.2 F (37.3 C)] 98.9 F (37.2 C) (11/05 0637) Pulse Rate:  [43-69] 69  (11/05 0637) Resp:  [10-16] 14  (11/05 0637) BP: (142-219)/(58-84) 172/65 mmHg (11/05 0637) SpO2:  [99 %-100 %] 99 % (11/05 0637) Weight:  [120.2 kg (264 lb 15.9 oz)] 120.2 kg (264 lb 15.9 oz) (11/04 1100)  Intake/Output from previous day:  Intake/Output Summary (Last 24 hours) at 03/30/12 0842 Last data filed at 03/30/12 0700  Gross per 24 hour  Intake 4095.33 ml  Output   3405 ml  Net 690.33 ml    Intake/Output this shift: UOP 1500  Labs:  [facility] 03/30/12 0434  HGB 10.8*    Basename 03/30/12 0434  WBC 7.5  RBC 3.55*  HCT 32.3*  PLT 165    Basename 03/30/12 0434  NA 136  K 4.2  CL 103  CO2 28  BUN 14  CREATININE 1.22  GLUCOSE 104*  CALCIUM 8.0*   No results found for this basename: LABPT:2,INR:2 in the last 72 hours  EXAM General - Patient is Alert, Appropriate and Oriented Extremity - Neurovascular intact Sensation intact distally Dorsiflexion/Plantar flexion intact Dressing - dressing C/D/I Motor Function - intact, moving foot and toes well on exam.  Hemovac pulled without difficulty.  Past Medical History  Diagnosis Date  . Shingles   . Hyperlipidemia   . Gout   . Statin myopathy     History of Statin Myalgias  . Mumps     Childhood Illness  . Measles     Childhood Illness  . Arthritis   . Renal calculi     kidney stones, nocturia  . Cellulitis of lower leg     15 yrs ago  . Seasonal allergies   . Hypertension     hypercholesterolemia   LOV Dr AVA  with clearance 9/12,  EKG and  CHEST  XRAY on chart  . Pneumonia     hx of x 2     Assessment/Plan: 1 Day Post-Op Procedure(s) (LRB): TOTAL KNEE ARTHROPLASTY (Right) Active Problems:  Osteoarthritis of left knee  Postop Acute blood loss anemia  Estimated Body mass index is 35.89 kg/(m^2) as calculated from the following:   Height as of this encounter: 6' .047"[Documented in chart 03/23/12[(1.83 m).   Weight as of this encounter: 264 lb 15.9 oz(120.2 kg). Advance diet Up with therapy Discharge home with home health Maybe tomorrow if doing well.  DVT Prophylaxis - Xarelto, ASA 325 mg on hold Weight-Bearing as tolerated to right leg No vaccines. D/C O2 and Pulse OX and try on Room [address]  Patrica Duel 03/30/2012, 8:42 AM

## 2012-03-30 NOTE — Evaluation (Signed)
Occupational Therapy Evaluation Patient Details Name: Shawn Keller MRN: [id number] DOB: Aug 20, 1940 Today's Date: 03/30/2012 Time: 1478-2956 OT Time Calculation (min): 19 min  OT Assessment / Plan / Recommendation Clinical Impression  Pt doing well POD 1 LTKR. All education completed. Pt will have necessary level of A from spouse upon d/c.    OT Assessment  Patient does not need any further OT services    Follow Up Recommendations  No OT follow up    Barriers to Discharge      Equipment Recommendations  None recommended by OT    Recommendations for Other Services    Frequency       Precautions / Restrictions Precautions Precautions: Knee Required Braces or Orthoses: Knee Immobilizer - Right Knee Immobilizer - Right: Discontinue once straight leg raise with < 10 degree lag Restrictions Other Position/Activity Restrictions: WBAT   Pertinent Vitals/Pain Pt reported minimal pain with activity. Repositioned for comfort.    ADL  Grooming: Simulated;Min guard Where Assessed - Grooming: Supported standing Upper Body Bathing: Simulated;Set up Where Assessed - Upper Body Bathing: Unsupported sitting Lower Body Bathing: Simulated;Minimal assistance Where Assessed - Lower Body Bathing: Supported sit to stand Upper Body Dressing: Simulated;Set up Where Assessed - Upper Body Dressing: Unsupported sitting Lower Body Dressing: Minimal assistance;Performed Where Assessed - Lower Body Dressing: Supported sit to stand Toilet Transfer: Performed;Min guard Statistician Method: Sit to Barista: Raised toilet seat with arms (or 3-in-1 over toilet) Toileting - Clothing Manipulation and Hygiene: Simulated;Min guard Where Assessed - Toileting Clothing Manipulation and Hygiene: Sit to stand from 3-in-1 or toilet Equipment Used: Rolling walker Transfers/Ambulation Related to ADLs: Pt ambulated to the bathroom with minguard A and RW. ADL Comments: Pt was able to  don underwear and shorts with very minimal A to thread onto LEs. Pt stated he would be spongebathing until able to step into the shower.    OT Diagnosis:    OT Problem List:   OT Treatment Interventions:     OT Goals    Visit Information  Last OT Received On: 03/30/12 Assistance Needed: +1    Subjective Data  Subjective: I've been through this before. Patient Stated Goal: Go home tomorrow.   Prior Functioning     Home Living Lives With: Spouse Available Help at Discharge: Family Type of Home: House Home Access: Stairs to enter Secretary/administrator of Steps: 2 Home Layout: One level Bathroom Shower/Tub: Engineer, manufacturing systems: Standard Home Adaptive Equipment: Bedside commode/3-in-1;Straight cane;Walker - rolling Prior Function Level of Independence: Independent Able to Take Stairs?: Yes Driving: Yes Vocation: Part time employment Communication Communication: No difficulties Dominant Hand: Right         Vision/Perception     Cognition  Overall Cognitive Status: Appears within functional limits for tasks assessed/performed Arousal/Alertness: Awake/alert Orientation Level: Appears intact for tasks assessed Behavior During Session: [name] for tasks performed    Extremity/Trunk Assessment Right Upper Extremity Assessment RUE ROM/Strength/Tone: [facility] for tasks assessed Left Upper Extremity Assessment LUE ROM/Strength/Tone: WFL for tasks assessed     Mobility Bed Mobility Sit to Supine: 4: Min guard;HOB flat;With rail Details for Bed Mobility Assistance: min VCs for technique and using RLE to assist LLE. Transfers Sit to Stand: 4: Min guard;With upper extremity assist;From chair/3-in-1 Stand to Sit: 4: Min guard;With upper extremity assist;With armrests;To chair/3-in-1;To bed Details for Transfer Assistance: min cues for hand placement and safe technique.     Shoulder Instructions     Exercise    Balance  End of Session OT - End of  Session Activity Tolerance: Patient tolerated treatment well Patient left: in bed;with call bell/phone within reach  GO     Ardena Gangl A OTR/L [phone] 03/30/2012, 1:55 PM

## 2012-03-30 NOTE — Care Management Note (Addendum)
    Page 1 of 2   03/31/2012     2:36:02 PM   CARE MANAGEMENT NOTE 03/31/2012  Patient:  Shawn Keller, Shawn Keller   Account Number:  [id number]  Date Initiated:  03/30/2012  Documentation initiated by:  Colleen Can  Subjective/Objective Assessment:   dx total knee replacemnt     Action/Plan:   CM spoke with patient and spouse. Plans are home with [facility] services. Requesting [facility] and specific therapist. Already has DME   Anticipated DC Date:  03/31/2012   Anticipated DC Plan:  HOME W HOME HEALTH SERVICES  In-house referral  NA      DC Planning Services  CM consult      [facility] Choice  HOME HEALTH   Choice offered to / List presented to:  C-1 Patient   DME arranged  NA      DME agency  NA     HH arranged  HH-2 PT      HH agency  [facility]   Status of service:  Completed, signed off Medicare Important Message given?   (If response is "NO", the following Medicare IM given date fields will be blank) Date Medicare IM given:   Date Additional Medicare IM given:    Discharge Disposition:  HOME W HOME HEALTH SERVICES  Per UR Regulation:    If discussed at Long Length of Stay Meetings, dates discussed:    Comments:  03/31/2012 Damaris Schooner RN CCM [phone] HHpt orders faxed to [facility] [phone] with confirmation received. [facility] notified Maralyn Sago ) of pt's dischargetoday   03/30/2012 Damaris Schooner RN CCM [phone] [facility] NOTIFIED/SPOKE WITH SARAH IN INTAKE. [facility] CAN PROVIDE HH SERVICES. FAXED OP NOTE, H&P, HH ORDERS, FACE SHEET [phone] WITH CONFIRMATION. CM WILL FOLLOW

## 2012-03-30 NOTE — Progress Notes (Addendum)
Physical Therapy Treatment Patient Details Name: Shawn Keller MRN: [id number] DOB: Jun 01, 1940 Today's Date: 03/30/2012 Time: 4098-1191 PT Time Calculation (min): 28 min  PT Assessment / Plan / Recommendation Comments on Treatment Session  Pt ambulated again in hallway and performed a couple exercises.  Pt doing well and will practice stairs prior to d/c tomorrow.    Follow Up Recommendations  Home health PT     Does the patient have the potential to tolerate intense rehabilitation     Barriers to Discharge        Equipment Recommendations  None recommended by OT;None recommended by PT    Recommendations for Other Services    Frequency     Plan Discharge plan remains appropriate;Frequency remains appropriate    Precautions / Restrictions Precautions Precautions: Knee Required Braces or Orthoses: Knee Immobilizer - Right Knee Immobilizer - Right: Discontinue once straight leg raise with < 10 degree lag Restrictions Other Position/Activity Restrictions: WBAT   Pertinent Vitals/Pain 3/10 R knee pain, RN brought in pain meds, ice applied    Mobility  Bed Mobility Bed Mobility: Supine to Sit;Sit to Supine Supine to Sit: 5: Supervision Sitting - Scoot to Edge of Bed: 5: Supervision Sit to Supine: 4: Min guard;HOB flat;With rail Details for Bed Mobility Assistance: pt able to move R LE over EOB without assist, used sheet to assist onto bed Transfers Transfers: Sit to Stand;Stand to Sit Sit to Stand: 4: Min guard;With upper extremity assist;From bed Stand to Sit: 4: Min guard;With upper extremity assist;To bed Details for Transfer Assistance: verbal cues for hand placement Ambulation/Gait Ambulation/Gait Assistance: 4: Min guard;5: Supervision Ambulation Distance (Feet): 120 Feet Assistive device: Rolling walker Ambulation/Gait Assistance Details: verbal cues for RW distance and step length Gait Pattern: Step-to pattern;Antalgic    Exercises Total Joint  Exercises Ankle Circles/Pumps: AROM;Both;10 reps Quad Sets: AROM;Both;15 reps Short Arc QuadBarbaraann Keller;Right;15 reps Heel Slides: AAROM;Right;15 reps;Supine (with sheet)    PT Diagnosis:    PT Problem List:   PT Treatment Interventions:     PT Goals Acute Rehab PT Goals PT Goal: Supine/Side to Sit - Progress: Met PT Goal: Sit to Supine/Side - Progress: Met PT Goal: Sit to Stand - Progress: Progressing toward goal PT Goal: Stand to Sit - Progress: Progressing toward goal PT Goal: Ambulate - Progress: Progressing toward goal PT Goal: Up/Down Stairs - Progress: Progressing toward goal  Visit Information  Last PT Received On: 03/30/12 Assistance Needed: +1    Subjective Data  Subjective: I've company in here since you left.   Cognition  Overall Cognitive Status: Appears within functional limits for tasks assessed/performed Arousal/Alertness: Awake/alert Orientation Level: Appears intact for tasks assessed Behavior During Session: [facility] for tasks performed    Balance     End of Session PT - End of Session Equipment Utilized During Treatment: Right knee immobilizer Activity Tolerance: Patient tolerated treatment well Patient left: in bed;with call bell/phone within reach;with family/visitor present   GP     Shawn Keller,Shawn Keller 03/30/2012, 3:46 PM Pager: [phone]

## 2012-03-30 NOTE — Progress Notes (Signed)
Physical Therapy Treatment Patient Details Name: Shawn Keller MRN: [id number] DOB: 21-Oct-1940 Today's Date: 03/30/2012 Time: 7829-5621 PT Time Calculation (min): 23 min  PT Assessment / Plan / Recommendation Comments on Treatment Session  Pt ambulated and performed exercises.    Follow Up Recommendations  Home health PT     Does the patient have the potential to tolerate intense rehabilitation     Barriers to Discharge        Equipment Recommendations  None recommended by PT    Recommendations for Other Services    Frequency     Plan Discharge plan remains appropriate;Frequency remains appropriate    Precautions / Restrictions Precautions Precautions: Knee Required Braces or Orthoses: Knee Immobilizer - Right Knee Immobilizer - Right: Discontinue once straight leg raise with < 10 degree lag Restrictions Other Position/Activity Restrictions: WBAT   Pertinent Vitals/Pain Pt reports no pain due to premedication with morphine, ice applied, repositioned    Mobility  Bed Mobility Bed Mobility: Supine to Sit Supine to Sit: 4: Min guard;HOB elevated Details for Bed Mobility Assistance: pt did not require assist for R LE, verbal cues for technique Transfers Transfers: Sit to Stand;Stand to Sit Sit to Stand: 4: Min guard;With upper extremity assist;From bed Stand to Sit: 4: Min guard;With upper extremity assist;To chair/3-in-1 Details for Transfer Assistance: verbal cues for safe technique Ambulation/Gait Ambulation/Gait Assistance: 4: Min guard Ambulation Distance (Feet): 80 Feet Assistive device: Rolling walker Ambulation/Gait Assistance Details: verbal cues for sequence, RW distance, step length Gait Pattern: Step-to pattern;Antalgic    Exercises Total Joint Exercises Ankle Circles/Pumps: AROM;Both;15 reps Quad Sets: AROM;Both;15 reps Gluteal Sets: AROM;Both;15 reps Towel Squeeze: AROM;Both;15 reps Short Arc QuadBarbaraann Boys;Right Heel Slides: AROM;Right;15  reps;Seated Hip ABduction/ADduction: AROM;Right;10 reps Straight Leg Raises: AAROM;Right;10 reps   PT Diagnosis:    PT Problem List:   PT Treatment Interventions:     PT Goals Acute Rehab PT Goals PT Goal: Supine/Side to Sit - Progress: Progressing toward goal PT Goal: Sit to Stand - Progress: Progressing toward goal PT Goal: Stand to Sit - Progress: Progressing toward goal PT Goal: Ambulate - Progress: Progressing toward goal PT Goal: Up/Down Stairs - Progress: Progressing toward goal  Visit Information  Last PT Received On: 03/30/12 Assistance Needed: +1    Subjective Data  Subjective: I'm ready when you are.   Cognition  Overall Cognitive Status: Appears within functional limits for tasks assessed/performed    Balance     End of Session PT - End of Session Equipment Utilized During Treatment: Right knee immobilizer Activity Tolerance: Patient tolerated treatment well Patient left: in chair;with call bell/phone within reach   GP     Nalin Mazzocco,[name] 03/30/2012, 12:02 PM Pager: [phone]

## 2012-03-31 DIAGNOSIS — E871 Hypo-osmolality and hyponatremia: Secondary | ICD-10-CM

## 2012-03-31 LAB — BASIC METABOLIC PANEL
CO2: 26 mEq/L (ref 19–32)
Chloride: 99 mEq/L (ref 96–112)
GFR calc Af Amer: 66 mL/min — ABNORMAL LOW (ref >90)
Potassium: 3.8 mEq/L (ref 3.5–5.1)
Sodium: 132 mEq/L — ABNORMAL LOW (ref 135–145)

## 2012-03-31 LAB — CBC
Platelets: 184 10*3/uL (ref 150–400)
RBC: 3.39 MIL/uL — ABNORMAL LOW (ref 4.22–5.81)
RDW: 13.5 % (ref 11.5–15.5)
WBC: 9.5 10*3/uL (ref 4.0–10.5)

## 2012-03-31 MED ORDER — RIVAROXABAN 10 MG PO TABS: 10.0000 mg | ORAL_TABLET | Freq: Every day | ORAL | Status: DC

## 2012-03-31 MED ORDER — OXYCODONE HCL 5 MG PO TABS: 5.0000 mg | ORAL_TABLET | ORAL | Status: DC | PRN

## 2012-03-31 MED ORDER — METHOCARBAMOL 500 MG PO TABS: 500.0000 mg | ORAL_TABLET | Freq: Four times a day (QID) | ORAL | Status: DC | PRN

## 2012-03-31 NOTE — Progress Notes (Signed)
Physical Therapy Treatment Patient Details Name: Shawn Keller MRN: [id number] DOB: March 24, 1941 Today's Date: 03/31/2012 Time: 4098-1191 PT Time Calculation (min): 24 min  PT Assessment / Plan / Recommendation Comments on Treatment Session  Pt ambulated in hallway, practiced stairs and performed exercises.      Follow Up Recommendations  Home health PT     Does the patient have the potential to tolerate intense rehabilitation     Barriers to Discharge        Equipment Recommendations  None recommended by OT;None recommended by PT    Recommendations for Other Services    Frequency     Plan Discharge plan remains appropriate;Frequency remains appropriate    Precautions / Restrictions Precautions Precautions: Knee Required Braces or Orthoses: Knee Immobilizer - Right Knee Immobilizer - Right: Discontinue once straight leg raise with < 10 degree lag Restrictions Other Position/Activity Restrictions: WBAT   Pertinent Vitals/Pain 2-3/10 R knee, ice applied, RN in to give pain meds upon leaving room    Mobility  Bed Mobility Bed Mobility: Supine to Sit;Sit to Supine Supine to Sit: 6: Modified independent (Device/Increase time) Transfers Transfers: Sit to Stand;Stand to Sit Sit to Stand: 5: Supervision;With upper extremity assist;From bed Stand to Sit: With upper extremity assist;5: Supervision;To chair/3-in-1 Details for Transfer Assistance: verbal cue for R LE forward Ambulation/Gait Ambulation/Gait Assistance: 5: Supervision Ambulation Distance (Feet): 120 Feet Assistive device: Rolling walker Ambulation/Gait Assistance Details: verbal cue for RW distance Gait Pattern: Step-to pattern;Antalgic Stairs: Yes Stairs Assistance: 4: Min guard Stairs Assistance Details (indicate cue type and reason): verbal cues for safety with RW, pt able to recall correct sequence however needed cues for RW placement Stair Management Technique: Step to pattern;Backwards;With  walker Number of Stairs: 3  (performed twice)    Exercises Total Joint Exercises Ankle Circles/Pumps: AROM;Both;10 reps Quad Sets: AROM;Both;15 reps Towel Squeeze: AROM;Both;15 reps Short Arc Quad: Right;15 reps;AAROM Heel Slides: Right;AROM;15 reps;Seated Hip ABduction/ADduction: AROM;Right;10 reps Straight Leg Raises: AAROM;Right;10 reps   PT Diagnosis:    PT Problem List:   PT Treatment Interventions:     PT Goals Acute Rehab PT Goals PT Goal: Sit to Stand - Progress: Met PT Goal: Stand to Sit - Progress: Met PT Goal: Ambulate - Progress: Partly met PT Goal: Up/Down Stairs - Progress: Progressing toward goal  Visit Information  Last PT Received On: 03/31/12 Assistance Needed: +1    Subjective Data  Subjective: Oh yes, we need to do stairs before I go.   Cognition  Overall Cognitive Status: Appears within functional limits for tasks assessed/performed    Balance     End of Session PT - End of Session Equipment Utilized During Treatment: Right knee immobilizer Activity Tolerance: Patient tolerated treatment well Patient left: in chair;with call bell/phone within reach;with family/visitor present   GP     Shawn Keller 03/31/2012, 12:40 PM Pager: [phone]

## 2012-03-31 NOTE — Progress Notes (Signed)
   Subjective: 2 Days Post-Op Procedure(s) (LRB): TOTAL KNEE ARTHROPLASTY (Right) Patient reports pain as mild.  Pain conrtrolled. Patient seen in rounds for Dr. Lequita Halt. Patient is well, and has had no acute complaints or problems Patient is ready to go home later today.  Objective: Vital signs in last 24 hours: Temp:  [97.9 F (36.6 C)-98.9 F (37.2 C)] 98.5 F (36.9 C) (11/06 0625) Pulse Rate:  [68-80] 74  (11/06 0625) Resp:  [16-18] 16  (11/06 0625) BP: (166-214)/(69-95) 168/69 mmHg (11/06 0625) SpO2:  [92 %-99 %] 92 % (11/06 0625)  Intake/Output from previous day:  Intake/Output Summary (Last 24 hours) at 03/31/12 0840 Last data filed at 03/31/12 0738  Gross per 24 hour  Intake 1207.33 ml  Output   2675 ml  Net -1467.67 ml    Intake/Output this shift: Total I/O In: 240 [P.O.:240] Out: -   Labs:  Basename 03/31/12 0420 03/30/12 0434  HGB 10.3* 10.8*    Basename 03/31/12 0420 03/30/12 0434  WBC 9.5 7.5  RBC 3.39* 3.55*  HCT 30.4* 32.3*  PLT 184 165    Basename 03/31/12 0420 03/30/12 0434  NA 132* 136  K 3.8 4.2  CL 99 103  CO2 26 28  BUN 13 14  CREATININE 1.23 1.22  GLUCOSE 120* 104*  CALCIUM 8.1* 8.0*   No results found for this basename: LABPT:2,INR:2 in the last 72 hours  EXAM: General - Patient is Alert, Appropriate and Oriented Extremity - Neurovascular intact Sensation intact distally Dorsiflexion/Plantar flexion intact No cellulitis present Incision - clean, dry, scant drainage from previous Hemovac site, healing Motor Function - intact, moving foot and toes well on exam.   Assessment/Plan: 2 Days Post-Op Procedure(s) (LRB): TOTAL KNEE ARTHROPLASTY (Right) Procedure(s) (LRB): TOTAL KNEE ARTHROPLASTY (Right) Past Medical History  Diagnosis Date  . Shingles   . Hyperlipidemia   . Gout   . Statin myopathy     History of Statin Myalgias  . Mumps     Childhood Illness  . Measles     Childhood Illness  . Arthritis   . Renal  calculi     kidney stones, nocturia  . Cellulitis of lower leg     15 yrs ago  . Seasonal allergies   . Hypertension     hypercholesterolemia   LOV Dr AVA  with clearance 9/12,  EKG and CHEST  XRAY on chart  . Pneumonia     hx of x 2    Active Problems:  Osteoarthritis of left knee  Postop Acute blood loss anemia  Postop Hyponatremia  Estimated Body mass index is 35.89 kg/(m^2) as calculated from the following:   Height as of this encounter: 6' .047"[Documented in chart 03/23/12[(1.83 m).   Weight as of this encounter: 264 lb 15.9 oz(120.2 kg). Discharge home with home health Diet - Cardiac diet Follow up - in 2 weeks Activity - WBAT Disposition - Home Condition Upon Discharge - Good D/C Meds - See DC Summary DVT Prophylaxis - Xarelto  Sydny Schnitzler 03/31/2012, 8:40 AM

## 2012-03-31 NOTE — Discharge Summary (Signed)
Physician Discharge Summary   Patient ID: Shawn Keller MRN: [id number] DOB/AGE: 08/22/1940 71 y.o.  Admit date: 03/29/2012 Discharge date: 03/31/2012  Primary Diagnosis: Osteoarthritis Right knee   Admission Diagnoses:  Past Medical History  Diagnosis Date  . Shingles   . Hyperlipidemia   . Gout   . Statin myopathy     History of Statin Myalgias  . Mumps     Childhood Illness  . Measles     Childhood Illness  . Arthritis   . Renal calculi     kidney stones, nocturia  . Cellulitis of lower leg     15 yrs ago  . Seasonal allergies   . Hypertension     hypercholesterolemia   LOV Dr AVA  with clearance 9/12,  EKG and CHEST  XRAY on chart  . Pneumonia     hx of x 2    Discharge Diagnoses:   Active Problems:  Osteoarthritis of left knee  Postop Acute blood loss anemia  Postop Hyponatremia  Estimated Body mass index is 35.89 kg/(m^2) as calculated from the following:   Height as of this encounter: 6' .047"[Documented in chart 03/23/12[(1.83 m).   Weight as of this encounter: 264 lb 15.9 oz(120.2 kg).  Classification of overweight in adults according to BMI (WHO, 1998)   Procedure:  Procedure(s) (LRB): TOTAL KNEE ARTHROPLASTY (Right)   Consults: None  HPI: DIMAGGIO WINEY is a 71 y.o. year old male with end stage OA of his right knee with progressively worsening pain and dysfunction. He has constant pain, with activity and at rest and significant functional deficits with difficulties even with ADLs. He has had extensive non-op management including analgesics, injections of cortisone and viscosupplements, and home exercise program, but remains in significant pain with significant dysfunction. Radiographs show bone on bone arthritis medial and patellofemoral with tibial subluxation. He presents now for right Total Knee Arthroplasty.   Laboratory Data: Admission on 03/29/2012  Component Date Value Range Status  . ABO/RH(D) 03/29/2012 B POS   Final  . Antibody  Screen 03/29/2012 NEG   Final  . Sample Expiration 03/29/2012 04/01/2012   Final  . WBC 03/30/2012 7.5  4.0 - 10.5 K/uL Final  . RBC 03/30/2012 3.55* 4.22 - 5.81 MIL/uL Final  . Hemoglobin 03/30/2012 10.8* 13.0 - 17.0 g/dL Final  . HCT 40/98/1191 32.3* 39.0 - 52.0 % Final  . MCV 03/30/2012 91.0  78.0 - 100.0 fL Final  . MCH 03/30/2012 30.4  26.0 - 34.0 pg Final  . MCHC 03/30/2012 33.4  30.0 - 36.0 g/dL Final  . RDW 47/82/9562 13.6  11.5 - 15.5 % Final  . Platelets 03/30/2012 165  150 - 400 K/uL Final  . Sodium 03/30/2012 136  135 - 145 mEq/L Final  . Potassium 03/30/2012 4.2  3.5 - 5.1 mEq/L Final  . Chloride 03/30/2012 103  96 - 112 mEq/L Final  . CO2 03/30/2012 28  19 - 32 mEq/L Final  . Glucose, Bld 03/30/2012 104* 70 - 99 mg/dL Final  . BUN 13/12/6576 14  6 - 23 mg/dL Final  . Creatinine, Ser 03/30/2012 1.22  0.50 - 1.35 mg/dL Final  . Calcium 46/96/2952 8.0* 8.4 - 10.5 mg/dL Final  . GFR calc non Af Amer 03/30/2012 57* >90 mL/min Final  . GFR calc Af Amer 03/30/2012 67* >90 mL/min Final   Comment:  The eGFR has been calculated                          using the CKD EPI equation.                          This calculation has not been                          validated in all clinical                          situations.                          eGFR's persistently                          <90 mL/min signify                          possible Chronic Kidney Disease.  . WBC 03/31/2012 9.5  4.0 - 10.5 K/uL Final  . RBC 03/31/2012 3.39* 4.22 - 5.81 MIL/uL Final  . Hemoglobin 03/31/2012 10.3* 13.0 - 17.0 g/dL Final  . HCT 16/02/9603 30.4* 39.0 - 52.0 % Final  . MCV 03/31/2012 89.7  78.0 - 100.0 fL Final  . MCH 03/31/2012 30.4  26.0 - 34.0 pg Final  . MCHC 03/31/2012 33.9  30.0 - 36.0 g/dL Final  . RDW 54/01/8118 13.5  11.5 - 15.5 % Final  . Platelets 03/31/2012 184  150 - 400 K/uL Final  . Sodium 03/31/2012 132* 135 - 145 mEq/L Final  . Potassium  03/31/2012 3.8  3.5 - 5.1 mEq/L Final  . Chloride 03/31/2012 99  96 - 112 mEq/L Final  . CO2 03/31/2012 26  19 - 32 mEq/L Final  . Glucose, Bld 03/31/2012 120* 70 - 99 mg/dL Final  . BUN 14/78/2956 13  6 - 23 mg/dL Final  . Creatinine, Ser 03/31/2012 1.23  0.50 - 1.35 mg/dL Final  . Calcium 21/30/8657 8.1* 8.4 - 10.5 mg/dL Final  . GFR calc non Af Amer 03/31/2012 57* >90 mL/min Final  . GFR calc Af Amer 03/31/2012 66* >90 mL/min Final   Comment:                                 The eGFR has been calculated                          using the CKD EPI equation.                          This calculation has not been                          validated in all clinical                          situations.                          eGFR's persistently                          <  90 mL/min signify                          possible Chronic Kidney Disease.  Hospital Outpatient Visit on 03/23/2012  Component Date Value Range Status  . aPTT 03/23/2012 31  24 - 37 seconds Final  . WBC 03/23/2012 5.6  4.0 - 10.5 K/uL Final  . RBC 03/23/2012 4.59  4.22 - 5.81 MIL/uL Final  . Hemoglobin 03/23/2012 14.0  13.0 - 17.0 g/dL Final  . HCT 16/02/9603 41.0  39.0 - 52.0 % Final  . MCV 03/23/2012 89.3  78.0 - 100.0 fL Final  . MCH 03/23/2012 30.5  26.0 - 34.0 pg Final  . MCHC 03/23/2012 34.1  30.0 - 36.0 g/dL Final  . RDW 54/01/8118 13.7  11.5 - 15.5 % Final  . Platelets 03/23/2012 232  150 - 400 K/uL Final  . Sodium 03/23/2012 137  135 - 145 mEq/L Final  . Potassium 03/23/2012 4.3  3.5 - 5.1 mEq/L Final  . Chloride 03/23/2012 102  96 - 112 mEq/L Final  . CO2 03/23/2012 26  19 - 32 mEq/L Final  . Glucose, Bld 03/23/2012 70  70 - 99 mg/dL Final  . BUN 14/78/2956 16  6 - 23 mg/dL Final  . Creatinine, Ser 03/23/2012 1.28  0.50 - 1.35 mg/dL Final  . Calcium 21/30/8657 9.4  8.4 - 10.5 mg/dL Final  . Total Protein 03/23/2012 7.6  6.0 - 8.3 g/dL Final  . Albumin 84/69/6295 3.8  3.5 - 5.2 g/dL Final  . AST  28/41/3244 25  0 - 37 U/L Final  . ALT 03/23/2012 13  0 - 53 U/L Final  . Alkaline Phosphatase 03/23/2012 69  39 - 117 U/L Final  . Total Bilirubin 03/23/2012 0.5  0.3 - 1.2 mg/dL Final  . GFR calc non Af Amer 03/23/2012 54* >90 mL/min Final  . GFR calc Af Amer 03/23/2012 63* >90 mL/min Final   Comment:                                 The eGFR has been calculated                          using the CKD EPI equation.                          This calculation has not been                          validated in all clinical                          situations.                          eGFR's persistently                          <90 mL/min signify                          possible Chronic Kidney Disease.  Marland Kitchen Prothrombin Time 03/23/2012 13.3  11.6 - 15.2 seconds Final  . INR 03/23/2012 1.02  0.00 - 1.49 Final  .  Color, Urine 03/23/2012 YELLOW  YELLOW Final  . APPearance 03/23/2012 CLEAR  CLEAR Final  . Specific Gravity, Urine 03/23/2012 1.007  1.005 - 1.030 Final  . pH 03/23/2012 5.5  5.0 - 8.0 Final  . Glucose, UA 03/23/2012 NEGATIVE  NEGATIVE mg/dL Final  . Hgb urine dipstick 03/23/2012 NEGATIVE  NEGATIVE Final  . Bilirubin Urine 03/23/2012 NEGATIVE  NEGATIVE Final  . Ketones, ur 03/23/2012 NEGATIVE  NEGATIVE mg/dL Final  . Protein, ur 56/21/3086 NEGATIVE  NEGATIVE mg/dL Final  . Urobilinogen, UA 03/23/2012 0.2  0.0 - 1.0 mg/dL Final  . Nitrite 57/84/6962 NEGATIVE  NEGATIVE Final  . Leukocytes, UA 03/23/2012 MODERATE* NEGATIVE Final  . MRSA, PCR 03/23/2012 NEGATIVE  NEGATIVE Final  . Staphylococcus aureus 03/23/2012 NEGATIVE  NEGATIVE Final   Comment:                                 The Xpert SA Assay (FDA                          approved for NASAL specimens                          in patients over 87 years of age),                          is one component of                          a comprehensive surveillance                          program.  Test performance has                           been validated by [employer] for patients greater                          than or equal to 76 year old.                          It is not intended                          to diagnose infection nor to                          guide or monitor treatment.  . WBC, UA 03/23/2012 0-2  <3 WBC/hpf Final     X-Rays:Dg Chest 2 View  03/23/2012  *RADIOLOGY REPORT*  Clinical Data: Preop for knee replacement, former smoking his  CHEST - 2 VIEW  Comparison: Pre chest x-ray of 04/09/2010  Findings: The lungs are clear but hyperaerated.  Mediastinal contours are stable.  Mild cardiomegaly is stable.  The descending thoracic aorta is ectatic and unchanged.  There are degenerative changes throughout the thoracic spine.  IMPRESSION: Stable cardiomegaly.  No active lung disease.   Original Report Authenticated By: Juline Patch, M.D.  EKG: Orders placed during the hospital encounter of 03/23/12  . EKG 12-LEAD  . EKG 12-LEAD     Hospital Course:  Shawn Keller is a 71 y.o. who was admitted to [facility]. They were brought to the operating room on 03/29/2012 and underwent Procedure(s): TOTAL KNEE ARTHROPLASTY.  Patient tolerated the procedure well and was later transferred to the recovery room and then to the orthopaedic floor for postoperative care.  They were given PO and IV analgesics for pain control following their surgery.  They were given 24 hours of postoperative antibiotics of  Anti-infectives     Start     Dose/Rate Route Frequency Ordered Stop   03/29/12 1400   ceFAZolin (ANCEF) IVPB 2 g/50 mL premix        2 g 100 mL/hr over 30 Minutes Intravenous Every 6 hours 03/29/12 1100 03/29/12 2040   03/29/12 0640   ceFAZolin (ANCEF) 3 g in dextrose 5 % 50 mL IVPB        3 g 160 mL/hr over 30 Minutes Intravenous 60 min pre-op 03/29/12 0640 03/29/12 0809         and started on DVT prophylaxis in the form of Xarelto.   PT and OT were ordered for  total joint protocol.  Discharge planning consulted to help with postop disposition and equipment needs.  Patient had a good night on the evening of surgery and started to get up OOB with therapy on day one and walked 80 feet and then 120 feet.  Hemovac drain was pulled without difficulty.  Continued to work with therapy into day two and walked 120 feet twice.  Dressing was changed on day two and the incision was healing well.  Patient was seen in rounds and was ready to go home later that afternoon.   Discharge Medications: Prior to Admission medications   Medication Sig Start Date End Date Taking? Authorizing Provider  carvedilol (COREG) 25 MG tablet Take 12.5 mg by mouth 2 (two) times daily with a meal.    Yes Historical Provider, MD  lisinopril (PRINIVIL,ZESTRIL) 20 MG tablet Take 20 mg by mouth 2 (two) times daily.    Yes Historical Provider, MD  methocarbamol (ROBAXIN) 500 MG tablet Take 1 tablet (500 mg total) by mouth every 6 (six) hours as needed. 03/31/12   Pearlee Arvizu, PA  oxyCODONE (OXY IR/ROXICODONE) 5 MG immediate release tablet Take 1-2 tablets (5-10 mg total) by mouth every 3 (three) hours as needed. 03/31/12   Cricket Goodlin Julien Girt, PA  rivaroxaban (XARELTO) 10 MG TABS tablet Take 1 tablet (10 mg total) by mouth daily with breakfast. Take Xarelto for two and a half more weeks, then discontinue Xarelto. Once the patient has completed the Xarelto, they may resume the 325 mg Aspirin. 03/31/12   Kynzee Devinney, PA    Diet: Cardiac diet Activity:WBAT Follow-up:in 2 weeks Disposition - Home Discharged Condition: good   Discharge Orders    Future Orders Please Complete By Expires   Diet - low sodium heart healthy      Call MD / Call 911      Comments:   If you experience chest pain or shortness of breath, CALL 911 and be transported to the hospital emergency room.  If you develope a fever above 101 F, pus (white drainage) or increased drainage or redness at the wound, or  calf pain, call your surgeon's office.   Discharge instructions      Comments:   Pick up stool softner  and laxative for home. Do not submerge incision under water. May shower. Continue to use ice for pain and swelling from surgery.  Take Xarelto for two and a half more weeks, then discontinue Xarelto. Once the patient has completed the Xarelto, they may resume the 325 mg Aspirin.   Constipation Prevention      Comments:   Drink plenty of fluids.  Prune juice may be helpful.  You may use a stool softener, such as Colace (over the counter) 100 mg twice a day.  Use MiraLax (over the counter) for constipation as needed.   Increase activity slowly as tolerated      Patient may shower      Comments:   You may shower without a dressing once there is no drainage.  Do not wash over the wound.  If drainage remains, do not shower until drainage stops.   Weight bearing as tolerated      Driving restrictions      Comments:   No driving until released by the physician.   Lifting restrictions      Comments:   No lifting until released by the physician.   TED hose      Comments:   Use stockings (TED hose) for 3 weeks on both leg(s).  You may remove them at night for sleeping.   Change dressing      Comments:   Change dressing daily with sterile 4 x 4 inch gauze dressing and apply TED hose. Do not submerge the incision under water.   Do not put a pillow under the knee. Place it under the heel.      Do not sit on low chairs, stoools or toilet seats, as it may be difficult to get up from low surfaces          Medication List     As of 03/31/2012  8:45 AM    STOP taking these medications         aspirin 325 MG tablet      Fish Oil 1200 MG Caps      glucosamine-chondroitin 500-400 MG tablet      ibuprofen 200 MG tablet   Commonly known as: ADVIL,MOTRIN      vitamin B-12 500 MCG tablet   Commonly known as: CYANOCOBALAMIN      TAKE these medications         carvedilol 25 MG tablet    Commonly known as: COREG   Take 12.5 mg by mouth 2 (two) times daily with a meal.      lisinopril 20 MG tablet   Commonly known as: PRINIVIL,ZESTRIL   Take 20 mg by mouth 2 (two) times daily.      methocarbamol 500 MG tablet   Commonly known as: ROBAXIN   Take 1 tablet (500 mg total) by mouth every 6 (six) hours as needed.      oxyCODONE 5 MG immediate release tablet   Commonly known as: Oxy IR/ROXICODONE   Take 1-2 tablets (5-10 mg total) by mouth every 3 (three) hours as needed.      rivaroxaban 10 MG Tabs tablet   Commonly known as: XARELTO   Take 1 tablet (10 mg total) by mouth daily with breakfast. Take Xarelto for two and a half more weeks, then discontinue Xarelto.  Once the patient has completed the Xarelto, they may resume the 325 mg Aspirin.           Follow-up Information    Follow up with Ollen Gross  V, MD. Schedule an appointment as soon as possible for a visit in 2 weeks.   Contact information:   [address], [address] [place] [address] [phone]          Signed: Patrica Duel 03/31/2012, 8:45 AM

## 2012-03-31 NOTE — Progress Notes (Signed)
Physical Therapy Treatment Note   03/31/12 1500  PT Visit Information  Last PT Received On 03/31/12  Assistance Needed +1  PT Time Calculation  PT Start Time 1413  PT Stop Time 1423  PT Time Calculation (min) 10 min  Subjective Data  Subjective Thanks for your help.  Now I think I'm ready to get home.  Precautions  Precautions Knee  Required Braces or Orthoses Knee Immobilizer - Right  Knee Immobilizer - Right Discontinue once straight leg raise with < 10 degree lag  Restrictions  Other Position/Activity Restrictions WBAT  Cognition  Overall Cognitive Status Appears within functional limits for tasks assessed/performed  Bed Mobility  Bed Mobility Supine to Sit;Sit to Supine  Supine to Sit 6: Modified independent (Device/Increase time)  Sit to Supine 6: Modified independent (Device/Increase time)  Details for Bed Mobility Assistance increased time and effort this afternoon  Transfers  Transfers Sit to Stand;Stand to Sit  Sit to Stand 5: Supervision;With upper extremity assist;From bed  Stand to Sit With upper extremity assist;5: Supervision;To bed  Details for Transfer Assistance verbal cues for having RW close for transfers, pt attempted to get up without KI so cued to sit back down to don KI since unable to perform SLR yet  Ambulation/Gait  Ambulation/Gait Assistance 5: Supervision  Ambulation Distance (Feet) 120 Feet  Assistive device Rolling walker  Ambulation/Gait Assistance Details did well with RW, instructed to maintain KI with standing and mobility   Gait Pattern Step-to pattern;Antalgic  Stairs Yes  Stairs Assistance 4: Min guard  Stairs Assistance Details (indicate cue type and reason) verbal cues for technique and sequence, spouse educated to hold RW, spouse also given handout on stairs.  Stair Management Technique Step to pattern;Backwards;With walker  Number of Stairs 2   PT - End of Session  Equipment Utilized During Treatment Right knee immobilizer  Activity  Tolerance Patient tolerated treatment well  Patient left in bed;with call bell/phone within reach;with family/visitor present  PT - Assessment/Plan  Comments on Treatment Session Pt ambulated in hallway and pt and spouse performed steps with verbal cues.  Pt feels ready for d/c home.  PT Plan Discharge plan remains appropriate;Frequency remains appropriate  Follow Up Recommendations Home health PT  Equipment Recommended None recommended by OT;None recommended by PT  Acute Rehab PT Goals  PT Goal: Ambulate - Progress Partly met  PT Goal: Up/Down Stairs - Progress Partly met  PT General Charges  $$ ACUTE PT VISIT 1 Procedure  PT Treatments  $Gait Training 8-22 mins  Pain: pt reports no pain at rest upon leaving room   Zenovia Jarred, PT Pager: [phone]

## 2013-03-09 IMAGING — CR DG CHEST 2V
2 series · 2 of 2 positions shown · non-contrast
Comparison: Pre chest x-ray of 04/09/2010

CLINICAL DATA: Preop for knee replacement, former smoking his

CHEST - 2 VIEW

[w chest pa]
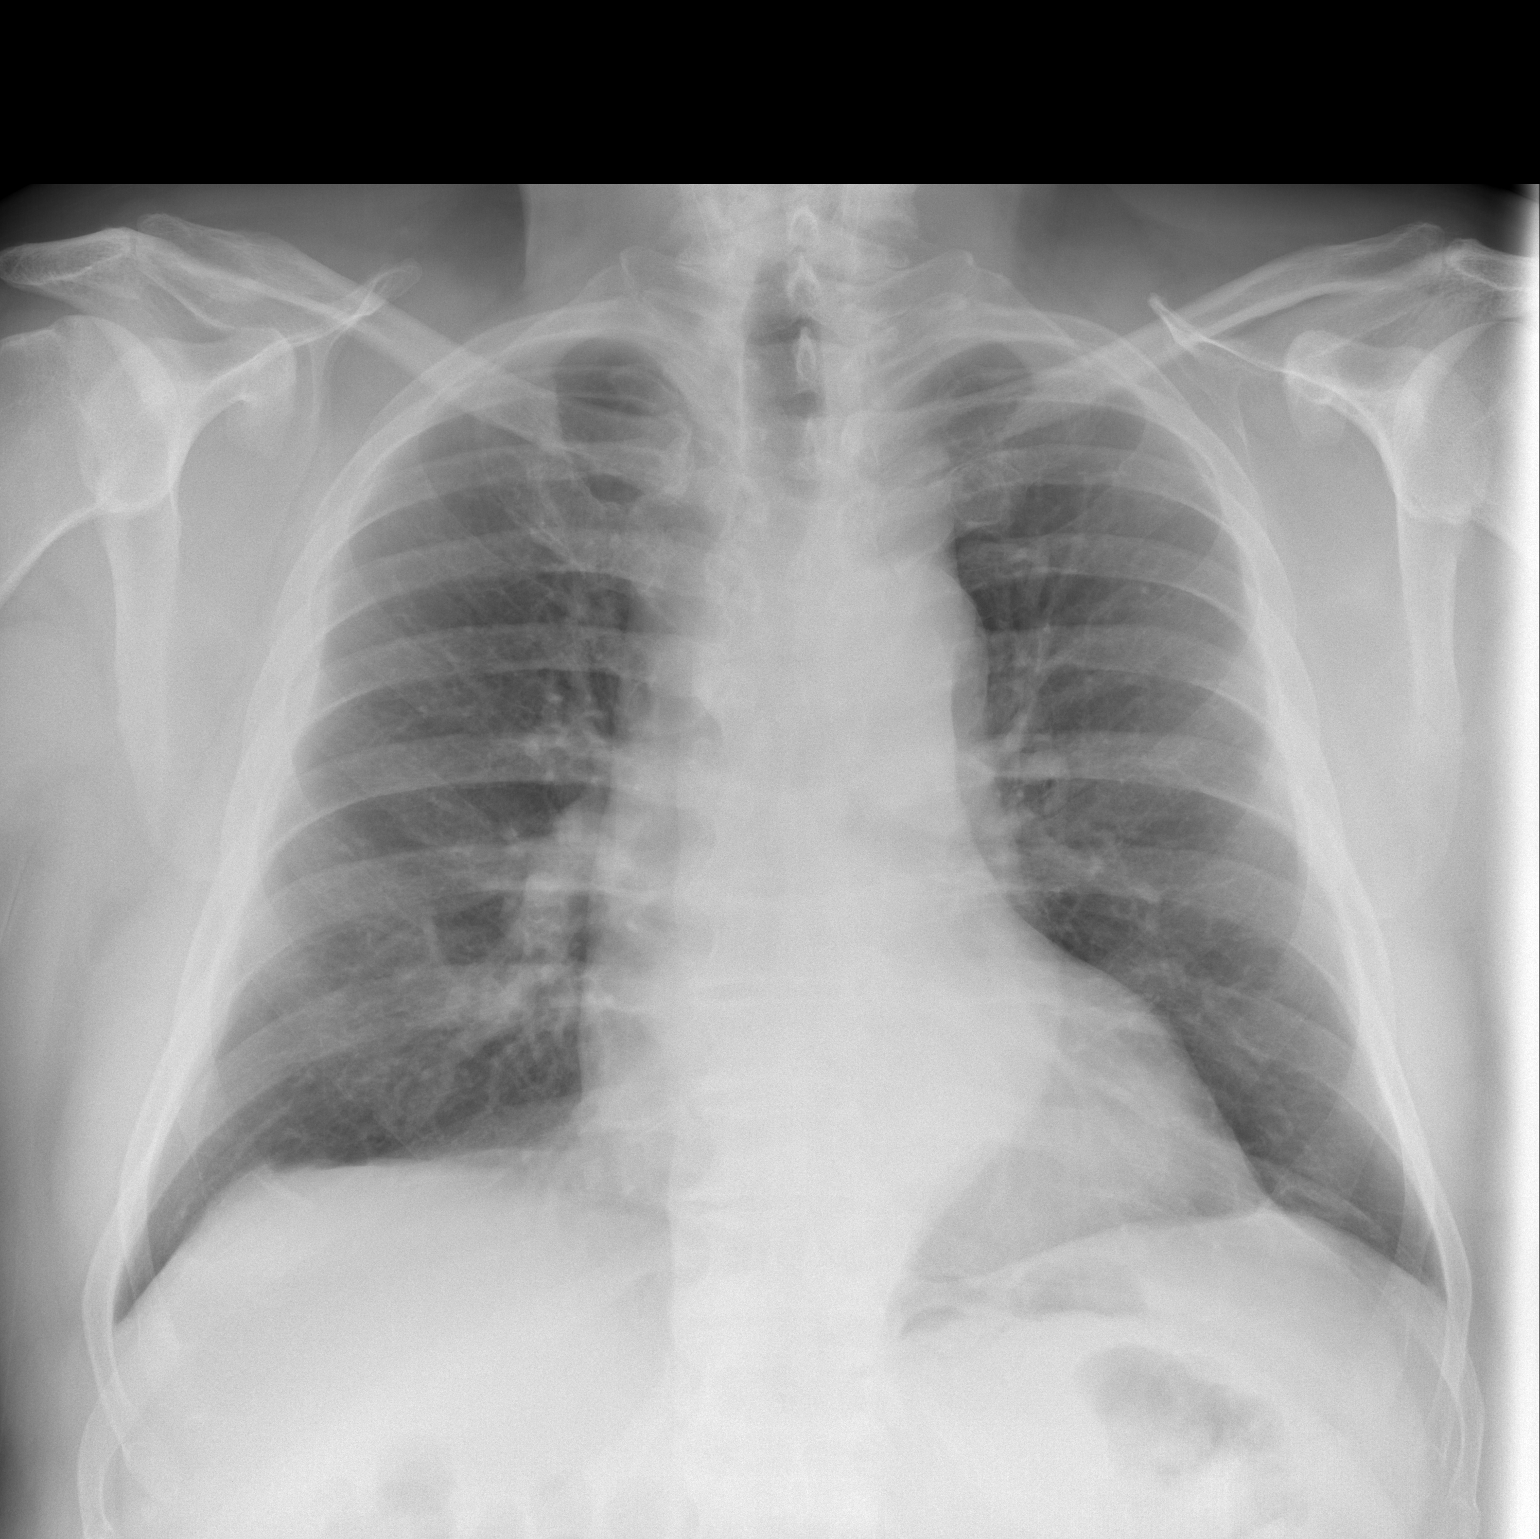

[w chest lat]
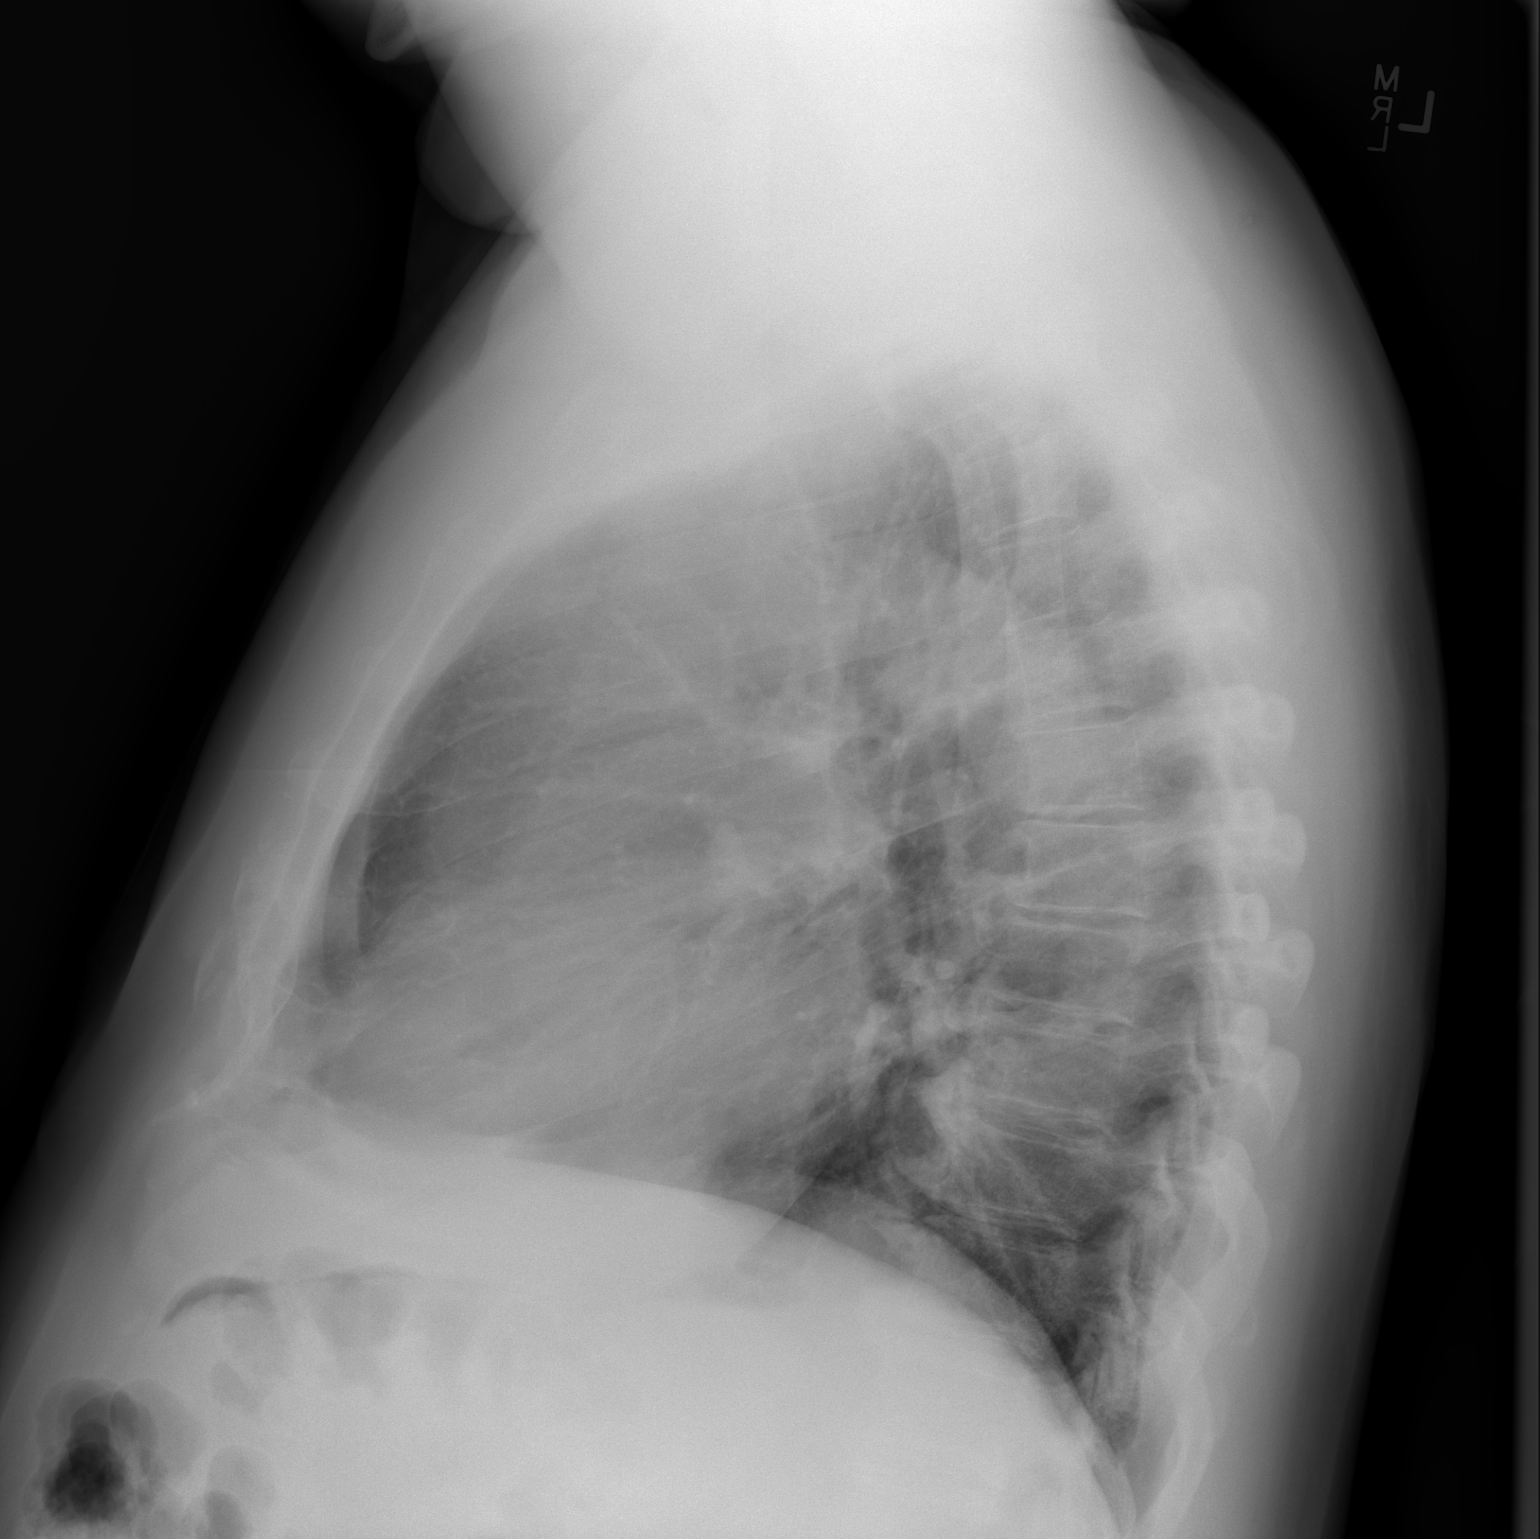

[2 of 2 positions shown; findings below may reference images not displayed]

FINDINGS: The lungs are clear but hyperaerated.  Mediastinal
contours are stable.  Mild cardiomegaly is stable.  The descending
thoracic aorta is ectatic and unchanged.  There are degenerative
changes throughout the thoracic spine.
IMPRESSION: Stable cardiomegaly.  No active lung disease.

## 2013-07-18 ENCOUNTER — Ambulatory Visit (INDEPENDENT_AMBULATORY_CARE_PROVIDER_SITE_OTHER): Payer: Medicare Other | Admitting: Internal Medicine

## 2013-07-18 ENCOUNTER — Encounter: Payer: Self-pay | Admitting: Internal Medicine

## 2013-07-18 VITALS — BP 219/87 | HR 60 | Ht 72.0 in | Wt 289.0 lb

## 2013-07-18 DIAGNOSIS — I10 Essential (primary) hypertension: Secondary | ICD-10-CM

## 2013-07-18 DIAGNOSIS — Z136 Encounter for screening for cardiovascular disorders: Secondary | ICD-10-CM

## 2013-07-18 LAB — BASIC METABOLIC PANEL
BUN: 14 mg/dL (ref 6–23)
CALCIUM: 9.2 mg/dL (ref 8.4–10.5)
CO2: 29 meq/L (ref 19–32)
CREATININE: 1.2 mg/dL (ref 0.4–1.5)
Chloride: 106 mEq/L (ref 96–112)
GFR: 63.51 mL/min (ref >60.00)
GLUCOSE: 85 mg/dL (ref 70–99)
Potassium: 4.4 mEq/L (ref 3.5–5.1)
Sodium: 140 mEq/L (ref 135–145)

## 2013-07-18 LAB — CBC WITH DIFFERENTIAL/PLATELET
BASOS PCT: 0.6 % (ref 0.0–3.0)
Basophils Absolute: 0 10*3/uL (ref 0.0–0.1)
EOS PCT: 6.9 % — AB (ref 0.0–5.0)
Eosinophils Absolute: 0.4 10*3/uL (ref 0.0–0.7)
HEMATOCRIT: 43.5 % (ref 39.0–52.0)
HEMOGLOBIN: 14.3 g/dL (ref 13.0–17.0)
LYMPHS ABS: 2 10*3/uL (ref 0.7–4.0)
Lymphocytes Relative: 31.8 % (ref 12.0–46.0)
MCHC: 32.8 g/dL (ref 30.0–36.0)
MCV: 93.5 fl (ref 78.0–100.0)
MONO ABS: 0.5 10*3/uL (ref 0.1–1.0)
MONOS PCT: 8.3 % (ref 3.0–12.0)
NEUTROS ABS: 3.3 10*3/uL (ref 1.4–7.7)
Neutrophils Relative %: 52.4 % (ref 43.0–77.0)
PLATELETS: 209 10*3/uL (ref 150.0–400.0)
RBC: 4.65 Mil/uL (ref 4.22–5.81)
RDW: 14.6 % (ref 11.5–14.6)
WBC: 6.3 10*3/uL (ref 4.5–10.5)

## 2013-07-18 MED ORDER — AMLODIPINE BESYLATE 5 MG PO TABS: 5.0000 mg | ORAL_TABLET | Freq: Every day | ORAL | Status: DC

## 2013-07-18 NOTE — Progress Notes (Signed)
HPI Patient is a 73 yo who is followed by Dr Dagmar Hait  Comes in for cardiac risk stratificaiton prior to starting exercise History of HTN, hyperlipidemia, DJD, obesity He has been treated with lipitor in past and had muscle achiness  He was seen by Vita Barley in 2007 for abnormal EKG  Had myoview done.  Unable to track results but patient reports normal.  He denies CP  Works out at gym a few days per wk.  10 min aerobic then wts  No change/decline in his abilty to do things Admits to eating too much  AM:  Oatmeal and muffin; Lunch:  Chcken and vegetable:   Dinner:  This is where he admits to eating too much  Has dessert often. Has lost wt in the past. No Known Allergies  Current Outpatient Prescriptions  Medication Sig Dispense Refill  . aspirin 325 MG tablet Take 325 mg by mouth daily.      . carvedilol (COREG) 25 MG tablet Take 12.5 mg by mouth 2 (two) times daily with a meal.       . Cyanocobalamin (VITAMIN B-12 CR) 1500 MCG TBCR Take 1 tablet by mouth daily.      Marland Kitchen GLUCOSAMINE-CHONDROITIN PO Take 2 tablets by mouth daily.      . Ibuprofen 200 MG CAPS Take 2 capsules by mouth daily.      Marland Kitchen losartan (COZAAR) 100 MG tablet Take 1 tablet by mouth daily.      . Omega-3 Fatty Acids (FISH OIL PO) Take 1 tablet by mouth daily.       No current facility-administered medications for this visit.    Past Medical History  Diagnosis Date  . Shingles   . Hyperlipidemia   . Gout   . Statin myopathy     History of Statin Myalgias  . Mumps     Childhood Illness  . Measles     Childhood Illness  . Arthritis   . Renal calculi     kidney stones, nocturia  . Cellulitis of lower leg     15 yrs ago  . Seasonal allergies   . Hypertension     hypercholesterolemia   LOV Dr AVA  with clearance 9/12,  EKG and CHEST  XRAY on chart  . Pneumonia     hx of x 2     Past Surgical History  Procedure Laterality Date  . Tonsillectomy    . Umbilical hernia repair    . Lithotripsy    . 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 Total knee arthroplasty  03/29/2012    Procedure: TOTAL KNEE ARTHROPLASTY;  Surgeon: Gearlean Alf, MD;  Location: [facility];  Service: Orthopedics;  Laterality: Right;    No family history on file.  History   Social History  . Marital Status: Married    Spouse Name: N/A    Number of Children: N/A  . Years of Education: N/A   Occupational History  . Not on file.   Social History Main Topics  . Smoking status: Former Smoker -- 1.00 packs/day for 10 years    Types: Cigarettes    Quit date: 04/24/1979  . Smokeless tobacco: Never Used  . Alcohol Use: 1.2 oz/week    2 Glasses of wine per week  . Drug Use: No  . Sexual Activity:    Other Topics Concern  .  Not on file   Social History Narrative  . No narrative on file    Review of Systems:  All systems reviewed.  They are negative to the above problem except as previously stated.  Vital Signs: BP 219/87  Pulse 60  Ht 6' (1.829 m)  Wt 289 lb (131.09 kg)  BMI 39.19 kg/m2 On my check:  BP L arm 210/90  Physical Exam Patient is a morbidly obese 73 yo in NAD HEENT:  Normocephalic, atraumatic. EOMI, PERRLA.  Neck: JVP is normal.  No bruits.  Lungs: clear to auscultation. No rales no wheezes.  Heart: Regular rate and rhythm. Normal S1, S2. No S3.   No significant murmurs. PMI not displaced.  Abdomen:  Supple, nontender. Normal bowel sounds. No masses. No hepatomegaly.  Extremities:   Good distal pulses throughout. No lower extremity edema.  Musculoskeletal :moving all extremities.  Neuro:   alert and oriented x3.  CN II-XII grossly intact.  SB 56 bpm  First degee AV block.  T wave inversion V5, V6, 1, L  (changes reported on previous ECG) Assessment and Plan:  1.  HTN  Much higher than in the fall when seen by R Avva.  I would add 5 mg Norvasc to regimen.  He has cuff to  use at home  Needs to bring in to get calibrated  2.  Morbid obesity.  Needs to lose wt  Discussed modifications to diet  I will refer to dietary. Walk  I would not perform more strenuous activity until bp under better control  3.  HL  Lipids in fall done  HDL 27, LDL was 168  Discussed diet  And wt loss.  Will set up for abdominal USN of aorta  Follow  Review CT for plaquing  I will set to see patient in 2 to 3 wks  I will also see at church.

## 2013-07-18 NOTE — Patient Instructions (Signed)
Your physician has recommended you make the following change in your medication:  1)START Amlodipine 5mg  daily, an Rx has been sent to your pharmacy  Lab today: [facility], [place] physician has requested that you have an abdominal aorta duplex. During this test, an ultrasound is used to evaluate the aorta. Allow 30 minutes for this exam. Do not eat after midnight the day before and avoid carbonated beverages  You have a follow up appt scheduled for 08/08/13 Dr.Ross @12 :15pm

## 2013-07-20 ENCOUNTER — Other Ambulatory Visit: Payer: Self-pay | Admitting: *Deleted

## 2013-07-20 DIAGNOSIS — E669 Obesity, unspecified: Secondary | ICD-10-CM

## 2013-07-21 ENCOUNTER — Telehealth (HOSPITAL_COMMUNITY): Payer: Self-pay | Admitting: Internal Medicine

## 2013-07-21 ENCOUNTER — Encounter (HOSPITAL_COMMUNITY): Payer: Medicare Other

## 2013-07-21 NOTE — Telephone Encounter (Signed)
Called patient to f/u BP It is down to 160 to 170 Feeling OK Keep on norvasc 5 for now.  I will f/u with him Appt for abd USN is in next 2 wks.

## 2013-08-01 ENCOUNTER — Ambulatory Visit (HOSPITAL_COMMUNITY): Payer: Medicare Other | Attending: Cardiology | Admitting: *Deleted

## 2013-08-01 ENCOUNTER — Encounter: Payer: Self-pay | Admitting: Cardiology

## 2013-08-01 DIAGNOSIS — Z136 Encounter for screening for cardiovascular disorders: Secondary | ICD-10-CM | POA: Insufficient documentation

## 2013-08-01 NOTE — Progress Notes (Signed)
Visceral aorta duplex complete

## 2013-08-08 ENCOUNTER — Encounter: Payer: Self-pay | Admitting: Internal Medicine

## 2013-08-08 ENCOUNTER — Ambulatory Visit (INDEPENDENT_AMBULATORY_CARE_PROVIDER_SITE_OTHER): Payer: Medicare Other | Admitting: Internal Medicine

## 2013-08-08 VITALS — BP 159/76 | HR 55 | Ht 72.0 in | Wt 285.0 lb

## 2013-08-08 DIAGNOSIS — I10 Essential (primary) hypertension: Secondary | ICD-10-CM

## 2013-08-08 DIAGNOSIS — E785 Hyperlipidemia, unspecified: Secondary | ICD-10-CM

## 2013-08-09 NOTE — Progress Notes (Signed)
HPI Patient is a 73 yo who is followed by Dr Dagmar Hait  Comes in for cardiac risk stratificaiton prior to starting exercise History of HTN, hyperlipidemia, DJD, obesity He has been treated with lipitor in past and had muscle achiness  He was seen by Vita Barley in 2007 for abnormal EKG  Had myoview done.  Unable to track results but patient reports normal.  He denies CP  Works out at gym a few days per wk.  10 min aerobic then wts  No change/decline in his abilty to do things Admits to eating too much  AM:  Oatmeal and muffin; Lunch:  Chcken and vegetable:   Dinner:  This is where he admits to eating too much  Has dessert often. Has lost wt in the past. No Known Allergies  Current Outpatient Prescriptions  Medication Sig Dispense Refill  . amLODipine (NORVASC) 5 MG tablet Take 1 tablet (5 mg total) by mouth daily.  30 tablet  11  . aspirin 325 MG tablet Take 325 mg by mouth daily.      . carvedilol (COREG) 25 MG tablet Take 12.5 mg by mouth 2 (two) times daily with a meal.       . Cyanocobalamin (VITAMIN B-12 CR) 1500 MCG TBCR Take 1 tablet by mouth daily.      Marland Kitchen GLUCOSAMINE-CHONDROITIN PO Take 2 tablets by mouth daily.      . Ibuprofen 200 MG CAPS Take 2 capsules by mouth daily.      Marland Kitchen loratadine (CLARITIN) 10 MG tablet Take 10 mg by mouth daily. During March and April      . losartan (COZAAR) 100 MG tablet Take 1 tablet by mouth daily.      . Omega-3 Fatty Acids (FISH OIL PO) Take 1 tablet by mouth daily.       No current facility-administered medications for this visit.    Past Medical History  Diagnosis Date  . Shingles   . Hyperlipidemia   . Gout   . Statin myopathy     History of Statin Myalgias  . Mumps     Childhood Illness  . Measles     Childhood Illness  . Arthritis   . Renal calculi     kidney stones, nocturia  . Cellulitis of lower leg     15 yrs ago  . Seasonal allergies   . Hypertension     hypercholesterolemia   LOV Dr AVA  with clearance 9/12,  EKG and CHEST   XRAY on chart  . Pneumonia     hx of x 2     Past Surgical History  Procedure Laterality Date  . Tonsillectomy    . Umbilical hernia repair    . Lithotripsy    . 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 Total knee arthroplasty  03/29/2012    Procedure: TOTAL KNEE ARTHROPLASTY;  Surgeon: Gearlean Alf, MD;  Location: [facility];  Service: Orthopedics;  Laterality: Right;    No family history on file.  History   Social History  . Marital Status: Married    Spouse Name: N/A    Number of Children: N/A  . Years of Education: N/A   Occupational History  . Not on file.   Social History Main Topics  . Smoking status: Former Smoker -- 1.00 packs/day for 10 years    Types: Cigarettes  Quit date: 04/24/1979  . Smokeless tobacco: Never Used  . Alcohol Use: 1.2 oz/week    2 Glasses of wine per week  . Drug Use: No  . Sexual Activity:    Other Topics Concern  . Not on file   Social History Narrative  . No narrative on file    Review of Systems:  All systems reviewed.  They are negative to the above problem except as previously stated.  Vital Signs: BP 159/76  Pulse 55  Ht 6' (1.829 m)  Wt 285 lb (129.275 kg)  BMI 38.64 kg/m2 On my check:  BP L arm 210/90  Physical Exam Patient is a morbidly obese 73 yo in NAD HEENT:  Normocephalic, atraumatic. EOMI, PERRLA.  Neck: JVP is normal.  No bruits.  Lungs: clear to auscultation. No rales no wheezes.  Heart: Regular rate and rhythm. Normal S1, S2. No S3.   No significant murmurs. PMI not displaced.  Abdomen:  Supple, nontender. Normal bowel sounds. No masses. No hepatomegaly.  Extremities:   Good distal pulses throughout. No lower extremity edema.  Musculoskeletal :moving all extremities.  Neuro:   alert and oriented x3.  CN II-XII grossly intact.  SB 56 bpm  First degee AV block.  T wave inversion V5,  V6, 1, L  (changes reported on previous ECG) Assessment and Plan:  1.  HTN BP is better than when I saw him a few wks ago.  High here in clinic  His cuffis reading higher than office cuff.  He says that BP is better at home 140s/. Will recheck monitor.  Continue to follow  I think he may end up on 5 bid amlodipine. I see patinet at church and will check  2.  Morbid obesity.  He has lost 5 lbs  Starting to work out  I would be happy to talk to his trainer  3.  HL  Last lipids high LDL 168  Will work on diet and wt loss before recheckng Abd USN was difficult but no evidence of aortic aneurysm  Will reviw with tech to see if any plaquing visible with this scan

## 2013-08-17 ENCOUNTER — Telehealth: Payer: Self-pay | Admitting: Internal Medicine

## 2013-08-17 MED ORDER — TRIAMTERENE-HCTZ 37.5-25 MG PO TABS: 0.5000 | ORAL_TABLET | Freq: Every day | ORAL | Status: DC

## 2013-08-17 NOTE — Addendum Note (Signed)
Addended by: Cristopher Estimable on: 08/17/2013 11:00 AM   Modules accepted: Orders, Medications

## 2013-08-17 NOTE — Telephone Encounter (Signed)
Saw patient at church  Yesterday BP is still high   Amlodipine 5 bid is leading ankle swelling  I left voicemail with on his phone WOuld recom going on Maxzide 37/5/25  Take 1/2 tab per day.. WIll have called in to pharmacy

## 2013-09-07 ENCOUNTER — Telehealth: Payer: Self-pay | Admitting: Internal Medicine

## 2013-09-07 ENCOUNTER — Other Ambulatory Visit: Payer: Self-pay | Admitting: *Deleted

## 2013-09-07 DIAGNOSIS — I10 Essential (primary) hypertension: Secondary | ICD-10-CM

## 2013-09-07 NOTE — Telephone Encounter (Signed)
SPoke to patient  I had placed on Maxzide.  He is also taking 5 of amlodipine qd His bp is better 120s to 140s/ Should have BMET   I told him he could come by at his convenience  May need reminder.

## 2013-09-22 ENCOUNTER — Other Ambulatory Visit (INDEPENDENT_AMBULATORY_CARE_PROVIDER_SITE_OTHER): Payer: Medicare Other

## 2013-09-22 DIAGNOSIS — I10 Essential (primary) hypertension: Secondary | ICD-10-CM

## 2013-09-22 LAB — BASIC METABOLIC PANEL
BUN: 27 mg/dL — AB (ref 6–23)
CALCIUM: 9 mg/dL (ref 8.4–10.5)
CHLORIDE: 102 meq/L (ref 96–112)
CO2: 28 mEq/L (ref 19–32)
CREATININE: 1.4 mg/dL (ref 0.4–1.5)
GFR: 50.94 mL/min — ABNORMAL LOW (ref >60.00)
Glucose, Bld: 95 mg/dL (ref 70–99)
Potassium: 4.4 mEq/L (ref 3.5–5.1)
Sodium: 137 mEq/L (ref 135–145)

## 2013-10-04 ENCOUNTER — Encounter: Payer: Self-pay | Admitting: Internal Medicine

## 2013-12-07 ENCOUNTER — Encounter: Payer: Medicare Other | Admitting: Internal Medicine

## 2013-12-09 ENCOUNTER — Ambulatory Visit (AMBULATORY_SURGERY_CENTER): Payer: Self-pay | Admitting: *Deleted

## 2013-12-09 VITALS — Ht 73.0 in | Wt 291.2 lb

## 2013-12-09 DIAGNOSIS — Z1211 Encounter for screening for malignant neoplasm of colon: Secondary | ICD-10-CM

## 2013-12-09 MED ORDER — MOVIPREP 100 G PO SOLR: ORAL | Status: DC

## 2013-12-09 NOTE — Progress Notes (Signed)
No allergies to eggs or soy. No problems with anesthesia.  Pt given Emmi instructions for colonoscopy  No oxygen use  No diet drug use  

## 2013-12-15 ENCOUNTER — Encounter: Payer: Self-pay | Admitting: Internal Medicine

## 2013-12-15 ENCOUNTER — Ambulatory Visit (AMBULATORY_SURGERY_CENTER): Payer: Medicare Other | Admitting: Internal Medicine

## 2013-12-15 VITALS — BP 135/79 | HR 51 | Temp 97.3°F | Resp 18 | Ht 73.0 in | Wt 291.0 lb

## 2013-12-15 DIAGNOSIS — D126 Benign neoplasm of colon, unspecified: Secondary | ICD-10-CM

## 2013-12-15 DIAGNOSIS — Z1211 Encounter for screening for malignant neoplasm of colon: Secondary | ICD-10-CM

## 2013-12-15 MED ORDER — SODIUM CHLORIDE 0.9 % IV SOLN: 500.0000 mL | INTRAVENOUS | Status: DC

## 2013-12-15 NOTE — Progress Notes (Signed)
Called to room to assist during endoscopic procedure.  Patient ID and intended procedure confirmed with present staff. Received instructions for my participation in the procedure from the performing physician.  

## 2013-12-15 NOTE — Op Note (Signed)
[place]  [address] [place], [id number]   COLONOSCOPY PROCEDURE REPORT  PATIENT: Shawn, Keller  MR#: [id number] BIRTHDATE: 1941-01-27 , 73  yrs. old GENDER: Male ENDOSCOPIST: Eustace Quail, MD REFERRED [id number] Avva, M.D. PROCEDURE DATE:  12/15/2013 PROCEDURE:   Colonoscopy with snare polypectomy x4 First Screening Colonoscopy - Avg.  risk and is 50 yrs.  old or older Yes.  Prior Negative Screening - Now for repeat screening. N/A  History of Adenoma - Now for follow-up colonoscopy & has been > or = to 3 yrs.  N/A  Polyps Removed Today? Yes. ASA CLASS:   Class II INDICATIONS:average risk screening. MEDICATIONS: MAC sedation, administered by CRNA and propofol (Diprivan) 350mg  IV  DESCRIPTION OF PROCEDURE:   After the risks benefits and alternatives of the procedure were thoroughly explained, informed consent was obtained.  A digital rectal exam revealed no abnormalities of the rectum.   The LB [id number]  endoscope was introduced through the anus and advanced to the cecum, which was identified by both the appendix and ileocecal valve. No adverse events experienced.   The quality of the prep was good, using MoviPrep  The instrument was then slowly withdrawn as the colon was fully examined.  COLON FINDINGS: Four polyps ranging between 3-33mm in size were found in the ascending and transverse colon.  A polypectomy was performed with a cold snare.  The resection was complete and the polyp tissue was completely retrieved.   Moderate diverticulosis was noted in the left colon.   The colon mucosa was otherwise normal. Retroflexed views revealed internal hemorrhoids. The time to cecum=2 minutes 55 seconds.  Withdrawal time=15 minutes 05 seconds. The scope was withdrawn and the procedure completed. COMPLICATIONS: There were no complications.  ENDOSCOPIC IMPRESSION: 1.   Four polyps  were found in the ascending colon and transverse colon;  polypectomy was performed with a cold snare 2.   Moderate diverticulosis was noted in the left colon 3.   The colon mucosa was otherwise normal  RECOMMENDATIONS: 1. Follow up colonoscopy in 3 or 5 years(pending pathology results)   eSigned:  Eustace Quail, MD 12/15/2013 10:54 AM   cc: Prince Solian, MD and The Patient

## 2013-12-15 NOTE — Progress Notes (Signed)
A/ox3, pleased with MAC, report to RN 

## 2013-12-15 NOTE — Patient Instructions (Signed)
YOU HAD AN ENDOSCOPIC PROCEDURE TODAY AT THE [facility]: Refer to the procedure report that was given to you for any specific questions about what was found during the examination.  If the procedure report does not answer your questions, please call your gastroenterologist to clarify.  If you requested that your care partner not be given the details of your procedure findings, then the procedure report has been included in a sealed envelope for you to review at your convenience later.  YOU SHOULD EXPECT: Some feelings of bloating in the abdomen. Passage of more gas than usual.  Walking can help get rid of the air that was put into your GI tract during the procedure and reduce the bloating. If you had a lower endoscopy (such as a colonoscopy or flexible sigmoidoscopy) you may notice spotting of blood in your stool or on the toilet paper. If you underwent a bowel prep for your procedure, then you may not have a normal bowel movement for a few days.  DIET: Your first meal following the procedure should be a light meal and then it is ok to progress to your normal diet.  A half-sandwich or bowl of soup is an example of a good first meal.  Heavy or fried foods are harder to digest and may make you feel nauseous or bloated.  Likewise meals heavy in dairy and vegetables can cause extra gas to form and this can also increase the bloating.  Drink plenty of fluids but you should avoid alcoholic beverages for 24 hours.  ACTIVITY: Your care partner should take you home directly after the procedure.  You should plan to take it easy, moving slowly for the rest of the day.  You can resume normal activity the day after the procedure however you should NOT DRIVE or use heavy machinery for 24 hours (because of the sedation medicines used during the test).    SYMPTOMS TO REPORT IMMEDIATELY: A gastroenterologist can be reached at any hour.  During normal business hours, 8:30 AM to 5:00 PM Monday through Friday,  call [phone].  After hours and on weekends, please call the GI answering service at [phone] who will take a message and have the physician on call contact you.   Following lower endoscopy (colonoscopy or flexible sigmoidoscopy):  Excessive amounts of blood in the stool  Significant tenderness or worsening of abdominal pains  Swelling of the abdomen that is new, acute  Fever of 100F or higher  FOLLOW UP: If any biopsies were taken you will be contacted by phone or by letter within the next 1-3 weeks.  Call your gastroenterologist if you have not heard about the biopsies in 3 weeks.  Our staff will call the home number listed on your records the next business day following your procedure to check on you and address any questions or concerns that you may have at that time regarding the information given to you following your procedure. This is a courtesy call and so if there is no answer at the home number and we have not heard from you through the emergency physician on call, we will assume that you have returned to your regular daily activities without incident.  SIGNATURES/CONFIDENTIALITY: You and/or your care partner have signed paperwork which will be entered into your electronic medical record.  These signatures attest to the fact that that the information above on your After Visit Summary has been reviewed and is understood.  Full responsibility of the confidentiality of this   discharge information lies with you and/or your care-partner.  Please read handouts given to you by your recovery room nurse.

## 2013-12-16 ENCOUNTER — Telehealth: Payer: Self-pay | Admitting: *Deleted

## 2013-12-16 NOTE — Telephone Encounter (Signed)
  Follow up Call-  Call back number 12/15/2013  Post procedure Call Back phone  # [phone]  Permission to leave phone message Yes     Patient questions:  Do you have a fever, pain , or abdominal swelling? No. Pain Score  0 *  Have you tolerated food without any problems? Yes.    Have you been able to return to your normal activities? Yes.    Do you have any questions about your discharge instructions: Diet   No. Medications  No. Follow up visit  No.  Do you have questions or concerns about your Care? No.  Actions: * If pain score is 4 or above: No action needed, pain <4.

## 2013-12-21 ENCOUNTER — Encounter: Payer: Self-pay | Admitting: Internal Medicine

## 2014-06-22 ENCOUNTER — Other Ambulatory Visit: Payer: Self-pay | Admitting: Internal Medicine

## 2014-07-13 ENCOUNTER — Other Ambulatory Visit: Payer: Self-pay | Admitting: Internal Medicine

## 2014-08-23 ENCOUNTER — Other Ambulatory Visit: Payer: Self-pay | Admitting: Internal Medicine

## 2014-11-01 ENCOUNTER — Other Ambulatory Visit: Payer: Self-pay | Admitting: Internal Medicine

## 2015-01-06 ENCOUNTER — Other Ambulatory Visit: Payer: Self-pay | Admitting: Internal Medicine

## 2015-01-24 ENCOUNTER — Other Ambulatory Visit: Payer: Self-pay | Admitting: Internal Medicine

## 2015-02-07 ENCOUNTER — Other Ambulatory Visit: Payer: Self-pay | Admitting: Internal Medicine

## 2015-03-25 NOTE — Progress Notes (Signed)
Cardiology Office Note   Date:  03/26/2015   ID:  AN SCHNABEL, DOB [birth year], MRN [id number]  PCP:  Tivis Ringer, MD  Cardiologist:   Dorris Carnes, MD   No chief complaint on file.     History of Present Illness: Shawn Keller is a 74 y.o. male with a history of HTN, obesity, HL    I saw him in March 2015  Since seen he has done well until 1 month ago  He was diagnosied with RMSF  Has just finished Rx with Doxy Prior to getting sick his endurance was good  No Cp  No SOB  Rare dizziness with standing  Wt down 17 lbs then wnt back up due to inactivity .    Current Outpatient Prescriptions  Medication Sig Dispense Refill  . acetaminophen (TYLENOL) 500 MG tablet Take 500 mg by mouth every 6 (six) hours as needed.    Marland Kitchen allopurinol (ZYLOPRIM) 300 MG tablet Take 300 mg by mouth daily.    Marland Kitchen amLODipine (NORVASC) 5 MG tablet Take 5 mg by mouth daily.  0  . amoxicillin (AMOXIL) 500 MG capsule Take four (4) capsules by mouth as needed one (1) hour prior to dental procedures.  0  . aspirin 325 MG tablet Take 325 mg by mouth daily.    . carvedilol (COREG) 25 MG tablet Take 12.5 mg by mouth 2 (two) times daily with a meal.     . Cyanocobalamin (VITAMIN B-12 CR) 1500 MCG TBCR Take 1 tablet by mouth daily.    Marland Kitchen GLUCOSAMINE-CHONDROITIN PO Take 2 tablets by mouth daily.    Marland Kitchen loratadine (CLARITIN) 10 MG tablet Take 10 mg by mouth daily. During March and April    . losartan (COZAAR) 100 MG tablet Take 1 tablet by mouth daily.    . Omega-3 Fatty Acids (FISH OIL PO) Take 1 tablet by mouth daily.    . pravastatin (PRAVACHOL) 40 MG tablet Take 40 mg by mouth daily.    Marland Kitchen triamterene-hydrochlorothiazide (MAXZIDE-25) 37.5-25 MG per tablet TAKE 0.5 TABLETS BY MOUTH DAILY. 30 tablet 3   No current facility-administered medications for this visit.    Allergies:   Review of patient's allergies indicates no known allergies.   Past Medical History  Diagnosis Date  . Shingles   .  Hyperlipidemia   . Gout   . Statin myopathy     History of Statin Myalgias  . Mumps     Childhood Illness  . Measles     Childhood Illness  . Arthritis   . Renal calculi     kidney stones, nocturia  . Cellulitis of lower leg     15 yrs ago  . Seasonal allergies   . Hypertension     hypercholesterolemia   LOV Dr AVA  with clearance 9/12,  EKG and CHEST  XRAY on chart  . Pneumonia     hx of x 2   . Allergy   . Cataract     Past Surgical History  Procedure Laterality Date  . Tonsillectomy  1946  . Umbilical hernia repair  2010  . Lithotripsy  2010    for kidney stone  . 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 Total knee arthroplasty  03/29/2012    Procedure: TOTAL KNEE ARTHROPLASTY;  Surgeon: Gearlean Alf, MD;  Location: [facility];  Service:  Orthopedics;  Laterality: Right;     Social History:  The patient  reports that he quit smoking about 35 years ago. His smoking use included Cigarettes. He has a 10 pack-year smoking history. He has never used smokeless tobacco. He reports that he drinks about 1.2 oz of alcohol per week. He reports that he does not use illicit drugs.   Family History:  The patient's family history includes Diabetes in his brother; Healthy in his brother and sister; Heart attack in his brother; Hypertension in his mother; Stroke in his father. There is no history of Colon cancer.    ROS:  Please see the history of present illness. All other systems are reviewed and  Negative to the above problem except as noted.    PHYSICAL EXAM: VS:  BP 120/66 mmHg  Pulse 56  Ht 6' (1.829 m)  Wt 281 lb 6.4 oz (127.642 kg)  BMI 38.16 kg/m2  GEN: Well nourished, well developed, in no acute distress HEENT: normal Neck: no JVD, carotid bruits, or masses Cardiac: RRR; no murmurs, rubs, or gallops,no edema  Respiratory:  clear to auscultation  bilaterally, normal work of breathing GI: soft, nontender, nondistended, + BS  No hepatomegaly  MS: no deformity Moving all extremities   Skin: warm and dry, no rash Neuro:  Strength and sensation are intact Psych: euthymic mood, full affect   EKG:  EKG is ordered today.  Sinus bradycardia  56 bpm  First degree AV block  PR 242 msec.     Lipid Panel No results found for: CHOL, TRIG, HDL, CHOLHDL, VLDL, LDLCALC, LDLDIRECT    Wt Readings from Last 3 Encounters:  03/26/15 281 lb 6.4 oz (127.642 kg)  12/15/13 291 lb (131.997 kg)  12/09/13 291 lb 3.2 oz (132.087 kg)      ASSESSMENT AND PLAN:  1.  HTN  BP is good   Continue  Will get labs  May be able to pull back as wt goes down    2.  HL  Will get labs from Dr Dagmar Hait     F/U in 1 year     Signed, Dorris Carnes, MD  03/26/2015 9:04 [address] [place], [place], [place]  [address] Phone: [phone]; Fax: [phone]

## 2015-03-26 ENCOUNTER — Encounter: Payer: Self-pay | Admitting: Internal Medicine

## 2015-03-26 ENCOUNTER — Ambulatory Visit (INDEPENDENT_AMBULATORY_CARE_PROVIDER_SITE_OTHER): Payer: Medicare Other | Admitting: Internal Medicine

## 2015-03-26 VITALS — BP 120/66 | HR 56 | Ht 72.0 in | Wt 281.4 lb

## 2015-03-26 DIAGNOSIS — I10 Essential (primary) hypertension: Secondary | ICD-10-CM | POA: Diagnosis not present

## 2015-03-26 DIAGNOSIS — E785 Hyperlipidemia, unspecified: Secondary | ICD-10-CM

## 2015-03-26 MED ORDER — AMLODIPINE BESYLATE 5 MG PO TABS: 5.0000 mg | ORAL_TABLET | Freq: Every day | ORAL | Status: DC

## 2015-03-26 NOTE — Patient Instructions (Signed)
Medication Instructions:  Your physician recommends that you continue on your current medications as directed. Please refer to the Current Medication list given to you today.   Labwork: I will get your lab work from your Primary Care Physician.   Testing/Procedures: none  Follow-Up: Your physician wants you to follow-up in: 12 months with Dr. Harrington Challenger. You will receive a reminder letter in the mail two months in advance. If you don't receive a letter, please call our office to schedule the follow-up appointment.   Any Other Special Instructions Will Be Listed Below (If Applicable).     If you need a refill on your cardiac medications before your next appointment, please call your pharmacy.

## 2015-04-07 ENCOUNTER — Other Ambulatory Visit: Payer: Self-pay | Admitting: Internal Medicine

## 2015-04-13 ENCOUNTER — Other Ambulatory Visit: Payer: Self-pay | Admitting: Internal Medicine

## 2015-12-31 ENCOUNTER — Encounter: Payer: Self-pay | Admitting: Internal Medicine

## 2016-03-21 ENCOUNTER — Other Ambulatory Visit: Payer: Self-pay | Admitting: Internal Medicine

## 2016-03-21 MED ORDER — TRIAMTERENE-HCTZ 37.5-25 MG PO TABS: 0.5000 | ORAL_TABLET | Freq: Every day | ORAL | 0 refills | Status: DC

## 2016-04-02 ENCOUNTER — Other Ambulatory Visit: Payer: Self-pay | Admitting: Internal Medicine

## 2016-04-02 DIAGNOSIS — E785 Hyperlipidemia, unspecified: Secondary | ICD-10-CM

## 2016-04-02 DIAGNOSIS — I10 Essential (primary) hypertension: Secondary | ICD-10-CM

## 2016-04-13 NOTE — Progress Notes (Signed)
Cardiology Office Note   Date:  04/14/2016   ID:  GERED DELPONTE, DOB 1940-11-07, MRN [id number]  PCP:  Tivis Ringer, MD  Cardiologist:   Dorris Carnes, MD   F/U of HTN     History of Present Illness: Shawn Keller is a 75 y.o. male with a history of HTN and HL  I saw him in clinic in Oct 2016  He follows with R Avva in medicine clinic   I saw him 1 year ago  Since thn he has done well from a cardiac standpoint  NO CP NO SOB  No dizziness Does get winded with actviity  Attrib to wt  Up since had back issues develop  Doing better after injuection     Outpatient Medications Prior to Visit  Medication Sig Dispense Refill  . allopurinol (ZYLOPRIM) 300 MG tablet Take 300 mg by mouth daily.    Marland Kitchen amLODipine (NORVASC) 5 MG tablet TAKE 1 TABLET (5 MG TOTAL) BY MOUTH DAILY. 90 tablet 0  . amoxicillin (AMOXIL) 500 MG capsule Take four (4) capsules by mouth as needed one (1) hour prior to dental procedures.  0  . aspirin 325 MG tablet Take 325 mg by mouth daily.    . carvedilol (COREG) 25 MG tablet Take 12.5 mg by mouth 2 (two) times daily with a meal.     . Cyanocobalamin (VITAMIN B-12 CR) 1500 MCG TBCR Take 1 tablet by mouth daily.    Marland Kitchen GLUCOSAMINE-CHONDROITIN PO Take 2 tablets by mouth daily.    Marland Kitchen loratadine (CLARITIN) 10 MG tablet Take 10 mg by mouth daily. During March and April    . losartan (COZAAR) 100 MG tablet Take 1 tablet by mouth daily.    . pravastatin (PRAVACHOL) 40 MG tablet Take 40 mg by mouth daily.    Marland Kitchen triamterene-hydrochlorothiazide (MAXZIDE-25) 37.5-25 MG tablet Take 0.5 tablets by mouth daily. 15 tablet 0  . acetaminophen (TYLENOL) 500 MG tablet Take 500 mg by mouth every 6 (six) hours as needed.    . Omega-3 Fatty Acids (FISH OIL PO) Take 1 tablet by mouth daily.     No facility-administered medications prior to visit.      Allergies:   Patient has no known allergies.   Past Medical History:  Diagnosis Date  . Allergy   . Arthritis   . Cataract     . Cellulitis of lower leg    15 yrs ago  . Gout   . Hyperlipidemia   . Hypertension    hypercholesterolemia   LOV Dr AVA  with clearance 9/12,  EKG and CHEST  XRAY on chart  . Measles    Childhood Illness  . Mumps    Childhood Illness  . Pneumonia    hx of x 2   . Renal calculi    kidney stones, nocturia  . Seasonal allergies   . Shingles   . Statin myopathy    History of Statin Myalgias    Past Surgical History:  Procedure Laterality Date  . LITHOTRIPSY  2010   for kidney stone  . TONSILLECTOMY  1946  . 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 UMBILICAL HERNIA REPAIR  2010  Social History:  The patient  reports that he quit smoking about 37 years ago. His smoking use included Cigarettes. He has a 10.00 pack-year smoking history. He has never used smokeless tobacco. He reports that he drinks about 1.2 oz of alcohol per week . He reports that he does not use drugs.   Family History:  The patient's family history includes Diabetes in his brother; Healthy in his brother and sister; Heart attack in his brother; Hypertension in his mother; Stroke in his father.    ROS:  Please see the history of present illness. All other systems are reviewed and  Negative to the above problem except as noted.    PHYSICAL EXAM: VS:  BP 134/62   Ht 6' (1.829 m)   Wt (!) 303 lb (137.4 kg)   BMI 41.09 kg/m   GEN: Morbidly obese 75 in no acute distress  HEENT: normal  Neck: no JVD, carotid bruits, or masses Cardiac: RRR; no murmurs, rubs, or gallops,no edema  Respiratory:  clear to auscultation bilaterally, normal work of breathing GI: soft, nontender, nondistended, + BS  No hepatomegaly  MS: no  deformity Moving all extremities   Skin: warm and dry, no rash Neuro:  Strength and sensation are intact Psych: euthymic mood, full affect   EKG:  EKG is ordered today.SB 54  First degree AV block 244     Lipid Panel No results found for: CHOL, TRIG, HDL, CHOLHDL, VLDL, LDLCALC, LDLDIRECT    Wt Readings from Last 3 Encounters:  04/14/16 (!) 303 lb (137.4 kg)  03/26/15 281 lb 6.4 oz (127.6 kg)  12/15/13 291 lb (132 kg)      ASSESSMENT AND PLAN:  1  HTN  Pt with rel bradycardia  I have recomm cutting bacin on coreg to 12.5 bid  Could increase amlodipine if BP necessitates F/U with echart response     Current medicines are reviewed at length with the patient today.  The patient does not have concerns regarding medicines.  Signed, Dorris Carnes, MD  04/14/2016 9:13 AM    [address] [place], [place], [place]  [address] Phone: [phone]; Fax: [phone]

## 2016-04-14 ENCOUNTER — Ambulatory Visit (INDEPENDENT_AMBULATORY_CARE_PROVIDER_SITE_OTHER): Payer: Medicare Other | Admitting: Internal Medicine

## 2016-04-14 ENCOUNTER — Encounter (INDEPENDENT_AMBULATORY_CARE_PROVIDER_SITE_OTHER): Payer: Self-pay

## 2016-04-14 ENCOUNTER — Encounter: Payer: Self-pay | Admitting: Internal Medicine

## 2016-04-14 VITALS — BP 134/62 | Ht 72.0 in | Wt 303.0 lb

## 2016-04-14 DIAGNOSIS — I10 Essential (primary) hypertension: Secondary | ICD-10-CM

## 2016-04-14 NOTE — Patient Instructions (Addendum)
Your physician has recommended you make the following change in your medication:  1.) stop taking carvedilol (Coreg) 25 mg twice a day and start taking it to 12.5 mg two times a day.  (Pt was taking what his bottle states, 25 mg twice a day, not what is listed in med list, 1/2 twice a day) Your physician wants you to follow-up in: 1 year with Dr. Harrington Challenger.  You will receive a reminder letter in the mail two months in advance. If you don't receive a letter, please call our office to schedule the follow-up appointment.

## 2016-05-22 ENCOUNTER — Telehealth: Payer: Self-pay | Admitting: Internal Medicine

## 2016-05-22 NOTE — Telephone Encounter (Signed)
New message    Pt wife verbalized that she is calling for rn because from her recollection she said that pt medications was suppose to be changed per last ov, but she don't know if it will be covered by insurance and she don't remember the name of the medication

## 2016-05-22 NOTE — Telephone Encounter (Signed)
Patient's wife called to report she has not yet checked on the price of the "new medication" because she got busy over Christmas and she forgot the name.  Per lab notes, "Notes Recorded by Rodman Key, RN on 05/09/2016 at 3:06 PM EST Spoke with patient's wife (DPR on file) Reviewed LDL. She will call insurance to find out cost of Crestor and will call back. Continue Pravastatin in the meantime. SB, 1st degree AVB at OV, coreg decreased, recent BPS: 138/80, 56  139/75, 61  138/77, 61 Routed to Dr. Harrington Challenger to inform."  Confirmed with patient she is to check on the cost of Crestor 10 mg daily and call back to discuss medication options. She agrees with treatment plan.

## 2016-05-23 ENCOUNTER — Other Ambulatory Visit: Payer: Self-pay | Admitting: Internal Medicine

## 2016-05-29 MED ORDER — CARVEDILOL 25 MG PO TABS: 25.0000 mg | ORAL_TABLET | Freq: Two times a day (BID) | ORAL | 3 refills | Status: DC

## 2016-05-29 NOTE — Telephone Encounter (Signed)
Notes Recorded by Fay Records, MD on 05/22/2016 at 4:52 PM EST Spoke with pt  He had achiness on other agents Keep on pravastatin Also, he has gone back up on metoprolol Saw no difference in energy BP did go up on lower dose  Otherwise feels good    Pt's beta blocker (Coreg) was decreased to 12.5 mg BID at last OV.   Orders/med list updated to reflect patient is now back to taking Coreg 25 mg BID.

## 2016-07-05 ENCOUNTER — Other Ambulatory Visit: Payer: Self-pay | Admitting: Internal Medicine

## 2016-07-05 DIAGNOSIS — E785 Hyperlipidemia, unspecified: Secondary | ICD-10-CM

## 2016-07-05 DIAGNOSIS — I10 Essential (primary) hypertension: Secondary | ICD-10-CM

## 2016-07-07 ENCOUNTER — Other Ambulatory Visit: Payer: Self-pay | Admitting: Internal Medicine

## 2016-07-07 DIAGNOSIS — E785 Hyperlipidemia, unspecified: Secondary | ICD-10-CM

## 2016-07-07 DIAGNOSIS — I10 Essential (primary) hypertension: Secondary | ICD-10-CM

## 2016-07-08 NOTE — Telephone Encounter (Signed)
Medication Detail    Disp Refills Start End   amLODipine (NORVASC) 5 MG tablet 90 tablet 2 07/07/2016    Sig - Route: TAKE 1 TABLET (5 MG TOTAL) BY MOUTH DAILY. - Oral   E-Prescribing Status: Receipt confirmed by pharmacy (07/07/2016 10:04 AM EST)   Associated Diagnoses   Essential hypertension     Hyperlipidemia     Pharmacy   [id number] - [place], [place] - [address] AT [place]

## 2016-09-30 ENCOUNTER — Encounter: Payer: Self-pay | Admitting: Internal Medicine

## 2017-03-20 ENCOUNTER — Other Ambulatory Visit: Payer: Self-pay

## 2017-03-20 DIAGNOSIS — I10 Essential (primary) hypertension: Secondary | ICD-10-CM

## 2017-03-20 MED ORDER — AMLODIPINE BESYLATE 5 MG PO TABS: 5.0000 mg | ORAL_TABLET | Freq: Every day | ORAL | 0 refills | Status: DC

## 2017-04-29 ENCOUNTER — Other Ambulatory Visit: Payer: Self-pay | Admitting: Internal Medicine

## 2017-04-29 DIAGNOSIS — I10 Essential (primary) hypertension: Secondary | ICD-10-CM

## 2017-05-10 ENCOUNTER — Other Ambulatory Visit: Payer: Self-pay | Admitting: Internal Medicine

## 2017-05-13 ENCOUNTER — Other Ambulatory Visit: Payer: Self-pay | Admitting: Internal Medicine

## 2017-05-13 DIAGNOSIS — I10 Essential (primary) hypertension: Secondary | ICD-10-CM

## 2017-05-21 NOTE — Progress Notes (Deleted)
Cardiology Office Note    Date:  05/21/2017   ID:  Shawn Keller, DOB 01/28/1941, MRN [id number]  PCP:  Prince Solian, MD  Cardiologist:  Dr. Harrington Challenger  Chief Complaint: Yearly follow up   History of Present Illness:   Shawn Keller is a 76 y.o. male with hx of HTN and HLD presents for follow up.   Last seen by Dr. Harrington Challenger 03/2016.   Past Medical History:  Diagnosis Date  . Allergy   . Arthritis   . Cataract   . Cellulitis of lower leg    15 yrs ago  . Gout   . Hyperlipidemia   . Hypertension    hypercholesterolemia   LOV Dr AVA  with clearance 9/12,  EKG and CHEST  XRAY on chart  . Measles    Childhood Illness  . Mumps    Childhood Illness  . Pneumonia    hx of x 2   . Renal calculi    kidney stones, nocturia  . Seasonal allergies   . Shingles   . Statin myopathy    History of Statin Myalgias    Past Surgical History:  Procedure Laterality Date  . LITHOTRIPSY  2010   for kidney stone  . TONSILLECTOMY  1946  . 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 UMBILICAL HERNIA REPAIR  2010    Current Medications: Prior to Admission medications   Medication Sig Start Date End Date Taking? Authorizing Provider  allopurinol (ZYLOPRIM) 300 MG tablet Take 300 mg by mouth daily. 03/24/15   [provider]  amLODipine (NORVASC) 5 MG tablet Take 1 tablet (5 mg total) by mouth daily. Please keep upcoming appt in January for future refills. Thank you 05/14/17   Fay Records, MD  amoxicillin (AMOXIL) 500 MG capsule Take four (4) capsules by mouth as needed one (1) hour prior to dental procedures. 02/05/15   [provider]  aspirin 325 MG tablet Take 325 mg by mouth  daily.    [provider]  carvedilol (COREG) 25 MG tablet Take 1 tablet (25 mg total) by mouth 2 (two) times daily. 05/29/16 08/27/16  Fay Records, MD  Cyanocobalamin (VITAMIN B-12 CR) 1500 MCG TBCR Take 1 tablet by mouth daily.    [provider]  GLUCOSAMINE-CHONDROITIN PO Take 2 tablets by mouth daily.    [provider]  loratadine (CLARITIN) 10 MG tablet Take 10 mg by mouth daily. During March and April    [provider]  losartan (COZAAR) 100 MG tablet Take 1 tablet by mouth daily. 06/07/13   [provider]  pravastatin (PRAVACHOL) 40 MG tablet Take 40 mg by mouth daily. 03/19/15   [provider]  triamterene-hydrochlorothiazide (MAXZIDE-25) 37.5-25 MG tablet Take 0.5 tablets by mouth daily. Please keep upcoming appt in January before anymore refills. Thank you 05/11/17   Fay Records, MD    Allergies:   Patient has no known allergies.   Social History   Socioeconomic History  . Marital status: Married    Spouse name: Not on file  . Number of children: Not on file  . Years of education: Not on file  . Highest education level: Not  on file  Social Needs  . Financial resource strain: Not on file  . Food insecurity - worry: Not on file  . Food insecurity - inability: Not on file  . Transportation needs - medical: Not on file  . Transportation needs - non-medical: Not on file  Occupational History  . Not on file  Tobacco Use  . Smoking status: Former Smoker    Packs/day: 1.00    Years: 10.00    Pack years: 10.00    Types: Cigarettes    Last attempt to quit: 04/24/1979    Years since quitting: 38.1  . Smokeless tobacco: Never Used  Substance and Sexual Activity  . Alcohol use: Yes    Alcohol/week: 1.2 oz    Types: 2 Glasses of wine per week  . Drug use: No  . Sexual activity: Not on file  Other Topics Concern  . Not on file  Social History Narrative  . Not on file     Family History:  The patient's family history  includes Diabetes in his brother; Healthy in his brother and sister; Heart attack in his brother; Hypertension in his mother; Stroke in his father. ***  ROS:   Please see the history of present illness.    ROS All other systems reviewed and are negative.   PHYSICAL EXAM:   VS:  There were no vitals taken for this visit.   GEN: Well nourished, well developed, in no acute distress  HEENT: normal  Neck: no JVD, carotid bruits, or masses Cardiac: ***RRR; no murmurs, rubs, or gallops,no edema  Respiratory:  clear to auscultation bilaterally, normal work of breathing GI: soft, nontender, nondistended, + BS MS: no deformity or atrophy  Skin: warm and dry, no rash Neuro:  Alert and Oriented x 3, Strength and sensation are intact Psych: euthymic mood, full affect  Wt Readings from Last 3 Encounters:  04/14/16 (!) 303 lb (137.4 kg)  03/26/15 281 lb 6.4 oz (127.6 kg)  12/15/13 291 lb (132 kg)      Studies/Labs Reviewed:   EKG:  EKG is ordered today.  The ekg ordered today demonstrates ***  Recent Labs: No results found for requested labs within last 8760 hours.   Lipid Panel No results found for: CHOL, TRIG, HDL, CHOLHDL, VLDL, LDLCALC, LDLDIRECT  Additional studies/ records that were reviewed today include:   As above    ASSESSMENT & PLAN:    1. HTN  2. HLD   Medication Adjustments/Labs and Tests Ordered: Current medicines are reviewed at length with the patient today.  Concerns regarding medicines are outlined above.  Medication changes, Labs and Tests ordered today are listed in the Patient Instructions below. There are no Patient Instructions on file for this visit.   Jarrett Soho, [place]  05/21/2017 1:58 PM    [facility] [place], [place], [place]  [address] Phone: [phone]; Fax: [phone]

## 2017-05-27 ENCOUNTER — Ambulatory Visit: Payer: Medicare Other | Admitting: Physician Assistant

## 2017-06-01 ENCOUNTER — Ambulatory Visit (INDEPENDENT_AMBULATORY_CARE_PROVIDER_SITE_OTHER): Payer: Medicare Other | Admitting: Physician Assistant

## 2017-06-01 VITALS — BP 128/72 | HR 59 | Ht 72.0 in | Wt 302.0 lb

## 2017-06-01 DIAGNOSIS — R600 Localized edema: Secondary | ICD-10-CM | POA: Diagnosis not present

## 2017-06-01 DIAGNOSIS — E782 Mixed hyperlipidemia: Secondary | ICD-10-CM | POA: Diagnosis not present

## 2017-06-01 DIAGNOSIS — I10 Essential (primary) hypertension: Secondary | ICD-10-CM | POA: Diagnosis not present

## 2017-06-01 MED ORDER — AMLODIPINE BESYLATE 5 MG PO TABS: 5.0000 mg | ORAL_TABLET | Freq: Every day | ORAL | 3 refills | Status: DC

## 2017-06-01 MED ORDER — FUROSEMIDE 20 MG PO TABS: 20.0000 mg | ORAL_TABLET | Freq: Every day | ORAL | 3 refills | Status: DC | PRN

## 2017-06-01 NOTE — Progress Notes (Signed)
Cardiology Office Note    Date:  06/01/2017   ID:  Shawn Keller, DOB [birth year], MRN [id number]  PCP:  Prince Solian, MD  Cardiologist:  Dr. Harrington Challenger  Chief Complaint: 12   Months follow up  History of Present Illness:   Shawn Keller is a 77 y.o. male with hx of HTN and HLD presents for follow ip.   Last seen by Dr. Harrington Challenger 03/2016. Reduced coreg to 12.5mg  BID due to bradycardia.   Here today for yearly follow up and medication management.  No complaints.  Patient requests to [facility] 3 times per week.  Does bicycle and stretching exercise for about 45 minutes.  He also does yard work.  Denies angina or shortness of breath.  No orthopnea, PND, syncope, dizziness, melena or blood in his urine or stool.  The patient has noted intermittent lower extremity edema bilaterally, mostly while standing on his feet all day.  Compliant with low-sodium diet.   Past Medical History:  Diagnosis Date  . Allergy   . Arthritis   . Cataract   . Cellulitis of lower leg    15 yrs ago  . Gout   . Hyperlipidemia   . Hypertension    hypercholesterolemia   LOV Dr AVA  with clearance 9/12,  EKG and CHEST  XRAY on chart  . Measles    Childhood Illness  . Mumps    Childhood Illness  . Pneumonia    hx of x 2   . Renal calculi    kidney stones, nocturia  . Seasonal allergies   . Shingles   . Statin myopathy    History of Statin Myalgias    Past Surgical History:  Procedure Laterality Date  . LITHOTRIPSY  2010   for kidney stone  . TONSILLECTOMY  1946  . 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 UMBILICAL HERNIA REPAIR  2010    Current Medications:  Prior to Admission medications   Medication Sig  Start Date End Date Taking? Authorizing Provider  allopurinol (ZYLOPRIM) 300 MG tablet Take 300 mg by mouth daily. 03/24/15  Yes [provider]  amLODipine (NORVASC) 5 MG tablet Take 1 tablet (5 mg total) by mouth daily. Please keep upcoming appt in January for future refills. Thank you 05/14/17  Yes Fay Records, MD  amoxicillin (AMOXIL) 500 MG capsule Take four (4) capsules by mouth as needed one (1) hour prior to dental procedures. 02/05/15  Yes [provider]  aspirin 325 MG tablet Take 325 mg by mouth daily.   Yes [provider]  carvedilol (COREG) 12.5 MG tablet Take 12.5 mg by mouth 2 (two) times daily with a meal.   Yes [provider]  Cyanocobalamin (VITAMIN B-12 CR) 1500 MCG TBCR Take 1 tablet by mouth daily.   Yes [provider]  GLUCOSAMINE-CHONDROITIN PO Take 2 tablets by mouth daily.   Yes [provider]  loratadine (CLARITIN) 10 MG tablet Take 10 mg by mouth daily. During March and April   Yes [provider]  losartan (COZAAR) 100 MG tablet Take 1 tablet by mouth daily. 06/07/13  Yes [provider]  pravastatin (PRAVACHOL) 40 MG tablet Take 40 mg  by mouth daily. 03/19/15  Yes [provider]  triamterene-hydrochlorothiazide (MAXZIDE-25) 37.5-25 MG tablet Take 0.5 tablets by mouth daily. Please keep upcoming appt in January before anymore refills. Thank you 05/11/17  Yes Fay Records, MD     Allergies:   Patient has no known allergies.   Social History   Socioeconomic History  . Marital status: Married    Spouse name: Not on file  . Number of children: Not on file  . Years of education: Not on file  . Highest education level: Not on file  Social Needs  . Financial resource strain: Not on file  . Food insecurity - worry: Not on file  . Food insecurity - inability: Not on file  . Transportation needs - medical: Not on file  . Transportation needs - non-medical: Not on file  Occupational  History  . Not on file  Tobacco Use  . Smoking status: Former Smoker    Packs/day: 1.00    Years: 10.00    Pack years: 10.00    Types: Cigarettes    Last attempt to quit: 04/24/1979    Years since quitting: 38.1  . Smokeless tobacco: Never Used  Substance and Sexual Activity  . Alcohol use: Yes    Alcohol/week: 1.2 oz    Types: 2 Glasses of wine per week  . Drug use: No  . Sexual activity: Not on file  Other Topics Concern  . Not on file  Social History Narrative  . Not on file     Family History:  The patient's family history includes Diabetes in his brother; Healthy in his brother and sister; Heart attack in his brother; Hypertension in his mother; Stroke in his father.   ROS:   Please see the history of present illness.    ROS All other systems reviewed and are negative.   PHYSICAL EXAM:   VS:  BP 128/72   Pulse (!) 59   Ht 6' (1.829 m)   Wt (!) 302 lb (137 kg)   BMI 40.96 kg/m    GEN: Well nourished, well developed, in no acute distress  HEENT: normal  Neck: no JVD, carotid bruits, or masses Cardiac: RRR; no murmurs, rubs, or gallops, 1 + BL LE edema  Respiratory:  clear to auscultation bilaterally, normal work of breathing GI: soft, nontender, nondistended, + BS MS: no deformity or atrophy  Skin: warm and dry, no rash Neuro:  Alert and Oriented x 3, Strength and sensation are intact Psych: euthymic mood, full affect  Wt Readings from Last 3 Encounters:  06/01/17 (!) 302 lb (137 kg)  04/14/16 (!) 303 lb (137.4 kg)  03/26/15 281 lb 6.4 oz (127.6 kg)      Studies/Labs Reviewed:   EKG:  EKG is ordered today.  The ekg ordered today demonstrates sinus bradycardia rate of 59 bpm and first-degree AV block.  No acute changes.  Recent Labs: No results found for requested labs within last 8760 hours.   Lipid Panel No results found for: CHOL, TRIG, HDL, CHOLHDL, VLDL, LDLCALC, LDLDIRECT  Additional studies/ records that were reviewed today include:   As  above   ASSESSMENT & PLAN:    1. HTN -  stable and well controlled.  Continue current medication  2. HLD -Continue pravastatin.  Managed by PCP.  3. Bilateral lower extremity edema -No signs of orthopnea or PND.  Try compression stocking. PRN lasix.   Medication Adjustments/Labs and Tests Ordered: Current medicines are reviewed at length with the  patient today.  Concerns regarding medicines are outlined above.  Medication changes, Labs and Tests ordered today are listed in the Patient Instructions below. Patient Instructions  Medication Instructions:   START TAKING LASIX  20 MG AS NEEDED FOR SWELLING   If you need a refill on your cardiac medications before your next appointment, please call your pharmacy.  Labwork: NONE ORDERED  TODAY    Testing/Procedures: NONE ORDERED  TODAY   Follow-Up:  Your physician wants you to follow-up in: [place] will receive a reminder letter in the mail two months in advance. If you don't receive a letter, please call our office to schedule the follow-up appointment.     Any Other Special Instructions Will Be Listed Below (If Applicable).                                                                                                                                                      Jarrett Soho, [place]  06/01/2017 11:36 AM    [facility] [address], [place], [place]  [address] Phone: [phone]; Fax: [phone]

## 2017-06-01 NOTE — Patient Instructions (Signed)
Medication Instructions:   START TAKING LASIX  20 MG AS NEEDED FOR SWELLING   If you need a refill on your cardiac medications before your next appointment, please call your pharmacy.  Labwork: NONE ORDERED  TODAY    Testing/Procedures: NONE ORDERED  TODAY   Follow-Up:  Your physician wants you to follow-up in: [place] will receive a reminder letter in the mail two months in advance. If you don't receive a letter, please call our office to schedule the follow-up appointment.     Any Other Special Instructions Will Be Listed Below (If Applicable).

## 2017-06-10 ENCOUNTER — Other Ambulatory Visit: Payer: Self-pay | Admitting: Internal Medicine

## 2017-06-17 NOTE — Addendum Note (Signed)
Addended by: Sandrea Hammond D on: 06/17/2017 02:11 PM   Modules accepted: Orders

## 2017-08-22 ENCOUNTER — Other Ambulatory Visit: Payer: Self-pay | Admitting: Physician Assistant

## 2017-08-31 ENCOUNTER — Telehealth: Payer: Self-pay

## 2017-08-31 NOTE — Telephone Encounter (Signed)
Notes from Dr. Dagmar Hait attached to April file

## 2017-11-19 ENCOUNTER — Other Ambulatory Visit: Payer: Self-pay | Admitting: Physician Assistant

## 2018-05-29 ENCOUNTER — Other Ambulatory Visit: Payer: Self-pay | Admitting: Physician Assistant

## 2018-05-29 ENCOUNTER — Other Ambulatory Visit: Payer: Self-pay | Admitting: Internal Medicine

## 2018-05-29 DIAGNOSIS — I10 Essential (primary) hypertension: Secondary | ICD-10-CM

## 2018-07-23 ENCOUNTER — Encounter: Payer: Self-pay | Admitting: Internal Medicine

## 2018-07-23 ENCOUNTER — Ambulatory Visit (INDEPENDENT_AMBULATORY_CARE_PROVIDER_SITE_OTHER): Payer: Medicare Other | Admitting: Internal Medicine

## 2018-07-23 VITALS — BP 118/56 | HR 64 | Ht 72.0 in | Wt 302.2 lb

## 2018-07-23 DIAGNOSIS — I10 Essential (primary) hypertension: Secondary | ICD-10-CM | POA: Diagnosis not present

## 2018-07-23 DIAGNOSIS — R0602 Shortness of breath: Secondary | ICD-10-CM

## 2018-07-23 DIAGNOSIS — E782 Mixed hyperlipidemia: Secondary | ICD-10-CM | POA: Diagnosis not present

## 2018-07-23 MED ORDER — CARVEDILOL 6.25 MG PO TABS: 6.2500 mg | ORAL_TABLET | Freq: Two times a day (BID) | ORAL | 3 refills | Status: DC

## 2018-07-23 NOTE — Patient Instructions (Addendum)
Medication Instructions:  Your physician has recommended you make the following change in your medication:  1.) decrease carvedilol (Coreg) to 6.25 mg twice a day.  Since you have 12.5 mg tablets at home, you can take half tablet of your current bottle twice a day until you finish current bottle.  If you need a refill on your cardiac medications before your next appointment, please call your pharmacy.   Lab work: Today: NMR profile, BMET, HGA1C  If you have labs (blood work) drawn today and your tests are completely normal, you will receive your results only by: Marland Kitchen MyChart Message (if you have MyChart) OR . A paper copy in the mail If you have any lab test that is abnormal or we need to change your treatment, we will call you to review the results.  Testing/Procedures: Your physician has requested that you have an echocardiogram. Echocardiography is a painless test that uses sound waves to create images of your heart. It provides your doctor with information about the size and shape of your heart and how well your heart's chambers and valves are working. This procedure takes approximately one hour. There are no restrictions for this procedure.   Follow-Up: At [facility], you and your health needs are our priority.  As part of our continuing mission to provide you with exceptional heart care, we have created designated Provider Care Teams.  These Care Teams include your primary Cardiologist (physician) and Advanced Practice Providers (APPs -  Physician Assistants and Nurse Practitioners) who all work together to provide you with the care you need, when you need it. You will need a follow up appointment in:  6 months.  Please call our office 2 months in advance to schedule this appointment.  You may see Dorris Carnes, MD or one of the following Advanced Practice Providers on your designated Care Team: Richardson Dopp, PA-C [place], [place] . Daune Perch, NP  Any Other Special Instructions  Will Be Listed Below (If Applicable).  Addendum: pt will have labs drawn when he returns for echo.

## 2018-07-23 NOTE — Progress Notes (Addendum)
Cardiology Office Note   Date:  07/23/2018   ID:  Shawn Keller, DOB [birth year], MRN [id number]  PCP:  Prince Solian, MD  Cardiologist:   Dorris Carnes, MD   F/U of HTN     History of Present Illness: Shawn Keller is a 78 y.o. male with a history of HTN and HL He is followed by R Avva I saw the pt in Nov 2017   At That time I cut back on coreg due to bradycardia      He was seen by B Bhagat in Jan 2019    Had some LE swelling   Rx lasix as needed      The patient notes increased fatiguability over the past year   No Cp   Paces himself Wonders if some is due to his weight  Also is having signif pain from spinal stenosis   Due to have injection to back  this coming week He says with injection he weil usually will give him 12 to 16 months of relief    Outpatient Medications Prior to Visit  Medication Sig Dispense Refill  . allopurinol (ZYLOPRIM) 300 MG tablet Take 300 mg by mouth daily.    Marland Kitchen amLODipine (NORVASC) 5 MG tablet Take 1 tablet (5 mg total) by mouth daily. Please keep upcoming appt in February with Dr. Harrington Challenger for future refills. Thank you 90 tablet 0  . amoxicillin (AMOXIL) 500 MG capsule Take four (4) capsules by mouth as needed one (1) hour prior to dental procedures.  0  . aspirin EC 81 MG tablet Take 81 mg by mouth daily.    . carvedilol (COREG) 12.5 MG tablet Take 12.5 mg by mouth 2 (two) times daily with a meal.    . Cyanocobalamin (VITAMIN B-12 CR) 1500 MCG TBCR Take 1 tablet by mouth daily.    Marland Kitchen GLUCOSAMINE-CHONDROITIN PO Take 2 tablets by mouth daily.    Marland Kitchen loratadine (CLARITIN) 10 MG tablet Take 10 mg by mouth daily. During March and April    . losartan (COZAAR) 100 MG tablet Take 1 tablet by mouth daily.    . pravastatin (PRAVACHOL) 40 MG tablet Take 40 mg by mouth daily.    Marland Kitchen triamterene-hydrochlorothiazide (MAXZIDE-25) 37.5-25 MG tablet Take 0.5 tablets by mouth daily. Please keep upcoming appt in February for future refills. Thank you 45 tablet 0  .  furosemide (LASIX) 20 MG tablet TAKE 1 TABLET (20 MG TOTAL) BY MOUTH DAILY AS NEEDED (SWELLING). 90 tablet 2   No facility-administered medications prior to visit.      Allergies:   Other   Past Medical History:  Diagnosis Date  . Allergy   . Arthritis   . Cataract   . Cellulitis of lower leg    15 yrs ago  . Gout   . Hyperlipidemia   . Hypertension    hypercholesterolemia   LOV Dr AVA  with clearance 9/12,  EKG and CHEST  XRAY on chart  . Measles    Childhood Illness  . Mumps    Childhood Illness  . Pneumonia    hx of x 2   . Renal calculi    kidney stones, nocturia  . Seasonal allergies   . Shingles   . Statin myopathy    History of Statin Myalgias    Past Surgical History:  Procedure Laterality Date  . LITHOTRIPSY  2010   for kidney stone  . TONSILLECTOMY  1946  . 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 UMBILICAL HERNIA REPAIR  2010     Social History:  The patient  reports that he quit smoking about 39 years ago. His smoking use included cigarettes. He has a 10.00 pack-year smoking history. He has never used smokeless tobacco. He reports current alcohol use of about 2.0 standard drinks of alcohol per week. He reports that he does not use drugs.   Family History:  The patient's family history includes Diabetes in his brother; Healthy in his brother and sister; Heart attack in his brother; Hypertension in his mother; Stroke in his father.    ROS:  Please see the history of present illness. All other systems are reviewed and  Negative to the above problem except as noted.    PHYSICAL EXAM: VS:  BP (!) 118/56   Pulse 64   Ht 6' (1.829 m)   Wt (!) 302 lb 3.2 oz (137.1 kg)    BMI 40.99 kg/m   GEN: Morbidly obese 78 yo  in no acute distress  HEENT: normal  Neck: no JVD, carotid bruits, or masses Cardiac: RRR; no murmurs, rubs, or gallops,no edema  Respiratory:  clear to auscultation bilaterally, normal work of breathing GI: soft, nontender, + BS  No hepatomegaly  MS: no deformity Moving all extremities   Skin: warm and dry, no rash Neuro:  Strength and sensation are intact Psych: euthymic mood, full affect   EKG:  EKG is not ordered today  Lipid Panel No results found for: CHOL, TRIG, HDL, CHOLHDL, VLDL, LDLCALC, LDLDIRECT    Wt Readings from Last 3 Encounters:  07/23/18 (!) 302 lb 3.2 oz (137.1 kg)  06/01/17 (!) 302 lb (137 kg)  04/14/16 (!) 303 lb (137.4 kg)      ASSESSMENT AND PLAN:  1  HTN  BP is good   Would back down on coreg and see if HR comes up a bit how he feels   2.  Dyspnea.  Patient is overweight some of his symptoms may be due to this.  He also has significant back problems.  Some of his symptoms may be due to this.  I want to make sure that there is no significant CAD however.  I think he may slow down before he would develop symptoms of chest discomfort.  I would recommend for now starting with an ultrasound of his heart.  If this is normal consider a Lexiscan Myoview versus cardiac CT angiogram.  I have recommended again for his blood pressure to back down on carvedilol to 1/2 tab bid.  Will see if letting his heart rate increase he feels better. 3.  Dyslipidemia.  I would check a lipid/lipoma panel today his lipid and LDL control really need to be optimized.  His HDL is very low.  4  Morbid obesity   Pt says he does not eat that much  Discussed activity , diet   Wt loss  I will set to see the patient in September tentatively.  Further follow-up testing based on the current test results. Current medicines are reviewed at length with the patient today.  The patient does not have concerns regarding medicines.  Signed, Dorris Carnes,  MD  07/23/2018 8:39 AM    [facility] [address], [place], [place]  [address] Phone: [phone]; Fax: [phone]

## 2018-07-29 DIAGNOSIS — M5136 Other intervertebral disc degeneration, lumbar region: Secondary | ICD-10-CM | POA: Insufficient documentation

## 2018-08-04 ENCOUNTER — Other Ambulatory Visit: Payer: Medicare Other | Admitting: *Deleted

## 2018-08-04 ENCOUNTER — Other Ambulatory Visit: Payer: Self-pay

## 2018-08-04 ENCOUNTER — Ambulatory Visit (HOSPITAL_COMMUNITY): Payer: Medicare Other | Attending: Cardiovascular Disease

## 2018-08-04 DIAGNOSIS — R0602 Shortness of breath: Secondary | ICD-10-CM | POA: Diagnosis present

## 2018-08-04 DIAGNOSIS — I10 Essential (primary) hypertension: Secondary | ICD-10-CM

## 2018-08-04 DIAGNOSIS — E782 Mixed hyperlipidemia: Secondary | ICD-10-CM

## 2018-08-04 MED ORDER — PERFLUTREN LIPID MICROSPHERE
1.0000 mL | INTRAVENOUS | Status: AC | PRN
  Administered 2018-08-04: 2 mL via INTRAVENOUS

## 2018-08-05 LAB — BASIC METABOLIC PANEL
BUN / CREAT RATIO: 16 (ref 10–24)
BUN: 23 mg/dL (ref 8–27)
CO2: 25 mmol/L (ref 20–29)
Calcium: 9.3 mg/dL (ref 8.6–10.2)
Chloride: 100 mmol/L (ref 96–106)
Creatinine, Ser: 1.41 mg/dL — ABNORMAL HIGH (ref 0.76–1.27)
GFR calc non Af Amer: 47 mL/min/{1.73_m2} — ABNORMAL LOW (ref >59)
GFR, EST AFRICAN AMERICAN: 54 mL/min/{1.73_m2} — AB (ref >59)
Glucose: 105 mg/dL — ABNORMAL HIGH (ref 65–99)
Potassium: 4.9 mmol/L (ref 3.5–5.2)
Sodium: 139 mmol/L (ref 134–144)

## 2018-08-05 LAB — LIPOPROTEIN A (LPA): Lipoprotein (a): 175.3 nmol/L — ABNORMAL HIGH (ref <75.0)

## 2018-08-05 LAB — APOLIPOPROTEIN B: Apolipoprotein B: 81 mg/dL (ref <90)

## 2018-08-05 LAB — NMR, LIPOPROFILE
Cholesterol, Total: 136 mg/dL (ref 100–199)
HDL Particle Number: 22.9 umol/L — ABNORMAL LOW (ref >=30.5)
HDL-C: 40 mg/dL (ref >39)
LDL Particle Number: 999 nmol/L (ref <1000)
LDL Size: 20.6 nm (ref >20.5)
LDL-C: 85 mg/dL (ref 0–99)
LP-IR Score: 42 (ref <=45)
Small LDL Particle Number: 572 nmol/L — ABNORMAL HIGH (ref <=527)
Triglycerides: 57 mg/dL (ref 0–149)

## 2018-08-05 LAB — HEMOGLOBIN A1C
Est. average glucose Bld gHb Est-mCnc: 126 mg/dL
Hgb A1c MFr Bld: 6 % — ABNORMAL HIGH (ref 4.8–5.6)

## 2018-08-06 ENCOUNTER — Telehealth: Payer: Self-pay

## 2018-08-06 DIAGNOSIS — E782 Mixed hyperlipidemia: Secondary | ICD-10-CM

## 2018-08-06 NOTE — Telephone Encounter (Signed)
-----   Message from Fay Records, MD sent at 08/06/2018  9:05 AM EDT ----- Spoke to wife re blood work and echo Kidney function is stable  A1C 6    Lipids :   LDL particles are up some   With HDLvery low and Lp(a) high I would recomm tighter control Would switch from pravachol 40 to crestor 20 mg   F/U lipomed with LDL in 8 wks  Please confirm CBC has been done recently per Dr Clayborn Heron

## 2018-08-06 NOTE — Telephone Encounter (Signed)
LMTCB

## 2018-08-10 MED ORDER — ROSUVASTATIN CALCIUM 20 MG PO TABS: 20.0000 mg | ORAL_TABLET | Freq: Every day | ORAL | 3 refills | Status: DC

## 2018-08-10 NOTE — Telephone Encounter (Signed)
Notes recorded by Fay Records, MD on 08/06/2018 at 9:07 AM EDT Discussed resutls with pt's wife Pumping function of heart is normal  Heart is hypertrophied   There appears to be mild relaxation abnormaltity  With change in symptoms I would recomm Lexiscan myovue to evaluate for ischemia (at [address]) ________________________________________________________   Shawn Keller with patient. He is aware I will be calling him back re: myoview after reviewing with Dr. Harrington Challenger due to timing and plan for outpatient testing during time of coronavirus.  Pt is in agreement and expresses understanding.   Pt aware to stop pravastatin and pick up rosuvastatin and begin this.  Adv we will plan to check lipids in 2-3 months.

## 2018-08-12 NOTE — Telephone Encounter (Signed)
Spoke to pt  I would recomm not having myovue WIll call himi back later this summer to schedule when safe ALso , he has not switched cholesterol medicine.   At this point I would not   Keep on same meds  Do now want any potential side effect to give problems Will call him later this summer

## 2018-08-12 NOTE — Telephone Encounter (Signed)
Updated medication list. Pt to continue pravachol at this time. Will plan to schedule myoview in several months.

## 2018-08-12 NOTE — Telephone Encounter (Signed)
Routing to Dr. Harrington Challenger to review plan for [facility] before ordering.

## 2018-08-25 ENCOUNTER — Other Ambulatory Visit: Payer: Self-pay | Admitting: Physician Assistant

## 2018-08-25 ENCOUNTER — Other Ambulatory Visit: Payer: Self-pay | Admitting: Internal Medicine

## 2018-08-25 DIAGNOSIS — I10 Essential (primary) hypertension: Secondary | ICD-10-CM

## 2018-12-20 ENCOUNTER — Telehealth: Payer: Self-pay | Admitting: *Deleted

## 2018-12-20 DIAGNOSIS — R5383 Other fatigue: Secondary | ICD-10-CM

## 2018-12-20 DIAGNOSIS — I10 Essential (primary) hypertension: Secondary | ICD-10-CM

## 2018-12-20 DIAGNOSIS — R0602 Shortness of breath: Secondary | ICD-10-CM

## 2018-12-20 DIAGNOSIS — E782 Mixed hyperlipidemia: Secondary | ICD-10-CM

## 2018-12-20 NOTE — Telephone Encounter (Signed)
Left message for patient to call back. Per previous telephone notes and echo results, he is to be scheduled for myoview. Order placed.  Pt needs an appointment for lexiscan.

## 2019-01-18 ENCOUNTER — Telehealth (HOSPITAL_COMMUNITY): Payer: Self-pay

## 2019-01-18 NOTE — Telephone Encounter (Signed)
New message   Just an FYI. We have made several attempts to contact this patient including sending a letter to schedule or reschedule their MYOCARDIAL PERFUSION . We will be removing the patient from the WQ.   8.19.20 @ 2:46pm lm on home vm Ellyson Rarick  8.11.20 @ 1:51pm call home # listed - someone hung up the phone - call cell phone lm on vm - Alivia Cimino  8.4.20 @ 12:10pm lm on home vm Tyress Loden  7.27.20 @ 2:44pm lm on home vm - Dominick Zertuche

## 2019-01-19 NOTE — Telephone Encounter (Signed)
Will route to Dr. Harrington Challenger to make aware.

## 2019-03-26 NOTE — Telephone Encounter (Signed)
Pt last seen in clinic in Feb 2020 Should set up for f/u this winter with me in person

## 2019-03-28 NOTE — Telephone Encounter (Signed)
Message to scheduling to contact patient to make appointment for Feb 2021.

## 2019-05-10 ENCOUNTER — Other Ambulatory Visit: Payer: Self-pay | Admitting: Physician Assistant

## 2019-05-10 ENCOUNTER — Other Ambulatory Visit: Payer: Self-pay | Admitting: Internal Medicine

## 2019-05-10 DIAGNOSIS — I10 Essential (primary) hypertension: Secondary | ICD-10-CM

## 2019-05-11 MED ORDER — TRIAMTERENE-HCTZ 37.5-25 MG PO TABS: 0.5000 | ORAL_TABLET | Freq: Every day | ORAL | 0 refills | Status: DC

## 2019-06-16 DIAGNOSIS — E669 Obesity, unspecified: Secondary | ICD-10-CM | POA: Insufficient documentation

## 2019-06-16 DIAGNOSIS — I10 Essential (primary) hypertension: Secondary | ICD-10-CM | POA: Insufficient documentation

## 2019-06-22 ENCOUNTER — Ambulatory Visit: Payer: Medicare Other

## 2019-06-26 NOTE — Progress Notes (Signed)
Cardiology Office Note   Date:  06/27/2019   ID:  Shawn Keller, DOB Oct 31, 1940, MRN [id number]  PCP:  Prince Solian, MD  Cardiologist:   Dorris Carnes, MD   F/U of HTN     History of Present Illness: Shawn Keller is a 79 y.o. male with a history of HTN and HL He is followed by R Avva I saw the pt in Nov 2017   At That time I cut back on coreg due to bradycardia      He was seen by B Bhagat in Jan 2019    Had some LE swelling   Rx lasix as needed      I saw the pt in Jan 2020  At that time he complained of fatigueability Also having signif problems with spinal stenosis at that time I recomm a myovue scan to r/o ischemia    Since seen the patient has done okay he says overall he thinks he is doing pretty good he cannot do what he did years ago some he blames on his back problems and hip problems.  He says he has more giving out when he is doing exertion.  With walking he says he does okay.    He did denies chest discomfort.  No palpitations.  No dizziness.  Outpatient Medications Prior to Visit  Medication Sig Dispense Refill  . allopurinol (ZYLOPRIM) 300 MG tablet Take 300 mg by mouth daily.    Marland Kitchen amLODipine (NORVASC) 5 MG tablet TAKE 1 TABLET BY MOUTH DAILY. PLEASE KEEP UPCOMING APPT IN FEBRUARY WITH DR. Tionne Dayhoff FOR FUTURE REFILLS 90 tablet 0  . amoxicillin (AMOXIL) 500 MG capsule Take four (4) capsules by mouth as needed one (1) hour prior to dental procedures.  0  . aspirin EC 81 MG tablet Take 81 mg by mouth daily.    . carvedilol (COREG) 6.25 MG tablet Take 1 tablet (6.25 mg total) by mouth 2 (two) times daily. 180 tablet 3  . Cyanocobalamin (VITAMIN B-12 CR) 1500 MCG TBCR Take 1 tablet by mouth daily.    Marland Kitchen gabapentin (NEURONTIN) 100 MG capsule SMARTSIG:1 Capsule(s) By Mouth 1 to 3 Times Daily PRN    . GLUCOSAMINE-CHONDROITIN PO Take 2 tablets by mouth daily.    Marland Kitchen loratadine (CLARITIN) 10 MG tablet Take 10 mg by mouth daily. During March and April    . losartan  (COZAAR) 100 MG tablet Take 1 tablet by mouth daily.    . pravastatin (PRAVACHOL) 40 MG tablet Take 40 mg by mouth daily.    Marland Kitchen triamterene-hydrochlorothiazide (MAXZIDE-25) 37.5-25 MG tablet Take 0.5 tablets by mouth daily. Please keep upcoming appt in February for future refills. Thank you 45 tablet 0  . furosemide (LASIX) 20 MG tablet TAKE 1 TABLET (20 MG TOTAL) BY MOUTH DAILY AS NEEDED (SWELLING). 90 tablet 2   No facility-administered medications prior to visit.     Allergies:   Other   Past Medical History:  Diagnosis Date  . Allergy   . Arthritis   . Cataract   . Cellulitis of lower leg    15 yrs ago  . Gout   . Hyperlipidemia   . Hypertension    hypercholesterolemia   LOV Dr AVA  with clearance 9/12,  EKG and CHEST  XRAY on chart  . Measles    Childhood Illness  . Mumps    Childhood Illness  . Pneumonia    hx of x 2   . Renal calculi  kidney stones, nocturia  . Seasonal allergies   . Shingles   . Statin myopathy    History of Statin Myalgias    Past Surgical History:  Procedure Laterality Date  . LITHOTRIPSY  2010   for kidney stone  . TONSILLECTOMY  1946  . 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 UMBILICAL HERNIA REPAIR  2010     Social History:  The patient  reports that he quit smoking about 40 years ago. His smoking use included cigarettes. He has a 10.00 pack-year smoking history. He has never used smokeless tobacco. He reports current alcohol use of about 2.0 standard drinks of alcohol per week. He reports that he does not use drugs.   Family History:  The patient's family history includes Diabetes in his brother; Healthy in his brother and sister; Heart attack in  his brother; Hypertension in his mother; Stroke in his father.    ROS:  Please see the history of present illness. All other systems are reviewed and  Negative to the above problem except as noted.    PHYSICAL EXAM: VS:  BP 132/60   Pulse 79   Ht 6' (1.829 m)   Wt (!) 303 lb 12.8 oz (137.8 kg)   SpO2 91%   BMI 41.20 kg/m    On my check O2 sat on room air was 93% with walking in the back hallway it decreased to 89%. GEN: Morbidly obese 79 yo  in no acute distress  HEENT: normal  Neck: no definite JVD.  No bruits. Cardiac: RRR; no murmurs, rubs, or gallops,no edema  Respiratory: Mild wheezing on forced expiration GI: soft, nontender, + BS  No hepatomegaly  MS: no deformity Moving all extremities   Skin: warm and dry, no rash Neuro:  Strength and sensation are intact Psych: euthymic mood, full affect   EKG:  EKG is not ordered today not ordered today Lipid Panel No results found for: CHOL, TRIG, HDL, CHOLHDL, VLDL, LDLCALC, LDLDIRECT    Wt Readings from Last 3 Encounters:  06/27/19 (!) 303 lb 12.8 oz (137.8 kg)  07/23/18 (!) 302 lb 3.2 oz (137.1 kg)  06/01/17 (!) 302 lb (137 kg)      ASSESSMENT AND PLAN:  1 dyspnea.  Patient sats are marginal.  With walking he actually goes down to 89.  On exam he has some mild wheezing on forced expiration I discussed this with him.  I think he should have lung function studies and I will contact Dr. Randye Lobo I am about this.  I am happy to order but want to make sure he is informed.  I am not convinced of active angina.  He does limit his activity.  I cannot exclude that some of the dyspnea may be cardiac issues but again nothing progressive.  2 HTN  BP is adequately controlled.  Keep on same regimen      3.  Dyslipidemia.  Last LDL was 92 on pravastatin.  I would keep on this for now.  Again he has had no scans to allow a view to see if he has atherosclerosis.  4  Morbid obesity   I think if he could get his weight down 10 pounds he  would feel better from his back standpoint.  Also with his breathing he may have some restriction because of his size.  In the past he has been able to get down 60 pounds from where he is he says he gets some enjoyment and he knows some is just habit.  We discussed carb intake he will try to minimize and watch portion size.  5.  COVID-19.  At Christmas time many members of his family got sick but he stayed okay along with his wife.  Discussed plans if he were to get infected he should notify us he would be a candidate for monoclonal antibody use.  Set to see him in November.  Again I will be in touch with Dr. Randye Lobo his office Current medicines are reviewed at length with the patient today.  The patient does not have concerns regarding medicines.  Signed, Dorris Carnes, MD  06/27/2019 10:23 AM    [facility] [address], [place], [place]  [address] Phone: [phone]; Fax: [phone]

## 2019-06-27 ENCOUNTER — Encounter: Payer: Self-pay | Admitting: Internal Medicine

## 2019-06-27 ENCOUNTER — Other Ambulatory Visit: Payer: Self-pay

## 2019-06-27 ENCOUNTER — Ambulatory Visit: Payer: Medicare Other | Admitting: Internal Medicine

## 2019-06-27 VITALS — BP 132/60 | HR 79 | Ht 72.0 in | Wt 303.8 lb

## 2019-06-27 DIAGNOSIS — E782 Mixed hyperlipidemia: Secondary | ICD-10-CM | POA: Diagnosis not present

## 2019-06-27 DIAGNOSIS — I10 Essential (primary) hypertension: Secondary | ICD-10-CM | POA: Diagnosis not present

## 2019-06-27 DIAGNOSIS — R0600 Dyspnea, unspecified: Secondary | ICD-10-CM

## 2019-06-27 NOTE — Patient Instructions (Signed)
Medication Instructions:  °No changes °*If you need a refill on your cardiac medications before your next appointment, please call your pharmacy* ° ° °Lab Work: °none °If you have labs (blood work) drawn today and your tests are completely normal, you will receive your results only by: °• MyChart Message (if you have MyChart) OR °• A paper copy in the mail °If you have any lab test that is abnormal or we need to change your treatment, we will call you to review the results. ° ° °Testing/Procedures: °none ° ° °Follow-Up: °At [facility], you and your health needs are our priority.  As part of our continuing mission to provide you with exceptional heart care, we have created designated Provider Care Teams.  These Care Teams include your primary Cardiologist (physician) and Advanced Practice Providers (APPs -  Physician Assistants and Nurse Practitioners) who all work together to provide you with the care you need, when you need it. ° °Your next appointment:   °9 month(s) ° °The format for your next appointment:   °In Person ° °Provider:   °Paula Ross, MD ° ° °Other Instructions ° ° °

## 2019-07-01 ENCOUNTER — Ambulatory Visit: Payer: Medicare Other | Attending: Internal Medicine

## 2019-07-01 DIAGNOSIS — Z23 Encounter for immunization: Secondary | ICD-10-CM | POA: Insufficient documentation

## 2019-07-01 NOTE — Progress Notes (Signed)
   Covid-19 Vaccination Clinic  Name:  Shawn Keller    MRN: [id number] DOB: Jan 20, 1941  07/01/2019  Shawn Keller was observed post Covid-19 immunization for 15 minutes without incidence. He was provided with Vaccine Information Sheet and instruction to access the V-Safe system.   Shawn Keller was instructed to call 911 with any severe reactions post vaccine: Marland Kitchen Difficulty breathing  . Swelling of your face and throat  . A fast heartbeat  . A bad rash all over your body  . Dizziness and weakness    Immunizations Administered    Name Date Dose VIS Date Route   Pfizer COVID-19 Vaccine 07/01/2019  8:25 AM 0.3 mL 05/06/2019 Intramuscular   Manufacturer: [place]   Lot: [id number]   [place]: [id number]

## 2019-07-26 ENCOUNTER — Ambulatory Visit: Payer: Medicare Other | Attending: Internal Medicine

## 2019-07-26 DIAGNOSIS — Z23 Encounter for immunization: Secondary | ICD-10-CM | POA: Insufficient documentation

## 2019-07-26 NOTE — Progress Notes (Signed)
   Covid-19 Vaccination Clinic  Name:  Shawn Keller    MRN: [id number] DOB: November 17, 1940  07/26/2019  [name] was observed post Covid-19 immunization for 15 minutes without incident. He was provided with Vaccine Information Sheet and instruction to access the V-Safe system.   [name] was instructed to call 911 with any severe reactions post vaccine: Marland Kitchen Difficulty breathing  . Swelling of face and throat  . A fast heartbeat  . A bad rash all over body  . Dizziness and weakness   Immunizations Administered    Name Date Dose VIS Date Route   Pfizer COVID-19 Vaccine 07/26/2019  8:43 AM 0.3 mL 05/06/2019 Intramuscular   Manufacturer: [place]   Lot: [id number]   [place]: [id number]

## 2019-08-04 ENCOUNTER — Other Ambulatory Visit: Payer: Self-pay | Admitting: Internal Medicine

## 2019-08-04 DIAGNOSIS — I10 Essential (primary) hypertension: Secondary | ICD-10-CM

## 2019-08-18 ENCOUNTER — Other Ambulatory Visit: Payer: Self-pay | Admitting: Internal Medicine

## 2020-03-27 ENCOUNTER — Ambulatory Visit: Payer: Medicare Other

## 2020-04-23 ENCOUNTER — Other Ambulatory Visit: Payer: Self-pay | Admitting: Internal Medicine

## 2020-04-23 DIAGNOSIS — I10 Essential (primary) hypertension: Secondary | ICD-10-CM

## 2020-04-27 ENCOUNTER — Other Ambulatory Visit: Payer: Self-pay | Admitting: Internal Medicine

## 2020-07-15 NOTE — Progress Notes (Signed)
Cardiology Office Note   Date:  07/16/2020   ID:  Shawn Keller, DOB Jul 09, 1940, MRN [id number]  PCP:  Prince Solian, MD  Cardiologist:   Dorris Carnes, MD   F/U of HTN     History of Present Illness: Shawn Keller is a 80 y.o. male with a history of HTN and HL He is followed by R Avva I saw the pt in Nov 2017   At That time I cut back on coreg due to bradycardia    He was seen by B Bhagat in Jan 2019    Had some LE swelling   Rx lasix as needed      I saw the pt in Jan 2020  At that time he complained of fatigueability Also having signif problems with spinal stenosis at that time  I recomm a myovue scan to r/o ischemia  He did not have this done   I last saw the pt in clinic in Feb 2021 Since then he has done well  He was placed on gabapentin by R Avva and says the pain in his back is gone  Able to do things outside He has consciously worked on diet and has lost 45 lbs  He feels much better   [place] to lose more  Denies CP   Breathing is good   No edema   Overall, feels the best he has in a long time  BP at home has been 100s to 130s   He is concerned about it going down further in heat and if he loses wt  Outpatient Medications Prior to Visit  Medication Sig Dispense Refill  . allopurinol (ZYLOPRIM) 300 MG tablet Take 300 mg by mouth daily.    Marland Kitchen amLODipine (NORVASC) 5 MG tablet Take 1 tablet (5 mg total) by mouth daily. Please make yearly appt with Dr. Harrington Challenger for February 2022 for future refills. Thank you 1st attempt 90 tablet 0  . amoxicillin (AMOXIL) 500 MG capsule Take four (4) capsules by mouth as needed one (1) hour prior to dental procedures.  0  . aspirin EC 81 MG tablet Take 81 mg by mouth daily.    . carvedilol (COREG) 6.25 MG tablet TAKE 1 TABLET BY MOUTH TWICE A DAY 180 tablet 3  . Cyanocobalamin (VITAMIN B-12 CR) 1500 MCG TBCR Take 1 tablet by mouth daily.    Marland Kitchen gabapentin (NEURONTIN) 100 MG capsule SMARTSIG:1 Capsule(s) By Mouth 1 to 3 Times Daily PRN    .  GLUCOSAMINE-CHONDROITIN PO Take 2 tablets by mouth daily.    Marland Kitchen loratadine (CLARITIN) 10 MG tablet Take 10 mg by mouth daily. During March and April    . losartan (COZAAR) 100 MG tablet Take 1 tablet by mouth daily.    . pravastatin (PRAVACHOL) 40 MG tablet Take 40 mg by mouth daily.    Marland Kitchen triamterene-hydrochlorothiazide (MAXZIDE-25) 37.5-25 MG tablet TAKE 1/2 TABLET BY MOUTH EVERY DAY 45 tablet 0  . furosemide (LASIX) 20 MG tablet TAKE 1 TABLET (20 MG TOTAL) BY MOUTH DAILY AS NEEDED (SWELLING). 90 tablet 2   No facility-administered medications prior to visit.     Allergies:   Other   Past Medical History:  Diagnosis Date  . Allergy   . Arthritis   . Cataract   . Cellulitis of lower leg    15 yrs ago  . Gout   . Hyperlipidemia   . Hypertension    hypercholesterolemia   LOV Dr AVA  with clearance 9/12,  EKG and CHEST  XRAY on chart  . Measles    Childhood Illness  . Mumps    Childhood Illness  . Pneumonia    hx of x 2   . Renal calculi    kidney stones, nocturia  . Seasonal allergies   . Shingles   . Statin myopathy    History of Statin Myalgias    Past Surgical History:  Procedure Laterality Date  . LITHOTRIPSY  2010   for kidney stone  . TONSILLECTOMY  1946  . 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 UMBILICAL HERNIA REPAIR  2010     Social History:  The patient  reports that he quit smoking about 41 years ago. His smoking use included cigarettes. He has a 10.00 pack-year smoking history. He has never used smokeless tobacco. He reports current alcohol use of about 2.0 standard drinks of alcohol per week. He reports that he does not use drugs.   Family History:  The patient's  family history includes Diabetes in his brother; Healthy in his brother and sister; Heart attack in his brother; Hypertension in his mother; Stroke in his father.    ROS:  Please see the history of present illness. All other systems are reviewed and  Negative to the above problem except as noted.    PHYSICAL EXAM: VS:  BP 130/70   Pulse 74   Ht 6\' 1"  (1.854 m)   Wt 267 lb 6.4 oz (121.3 kg)   SpO2 96%   BMI 35.28 kg/m     GEN:  Obese 80 yo  in no acute distress  HEENT: normal  Neck: no JVD  No bruits. Cardiac: RRR; no murmurs, rubs, or gallops,no edema  Respiratory: CTA   GI: soft, nontender, + BS  No hepatomegaly  MS: no deformity Moving all extremities   Skin: warm and dry, no rash Neuro:  Strength and sensation are intact Psych: euthymic mood, full affect   EKG:  EKG is ordered today  SR 74 bpm   First degree AV block  PR 268 msec   Lipid Panel No results found for: CHOL, TRIG, HDL, CHOLHDL, VLDL, LDLCALC, LDLDIRECT    Wt Readings from Last 3 Encounters:  07/16/20 267 lb 6.4 oz (121.3 kg)  06/27/19 (!) 303 lb 12.8 oz (137.8 kg)  07/23/18 (!) 302 lb 3.2 oz (137.1 kg)      ASSESSMENT AND PLAN:  1  HTN  BP is good   He may indeed need to back down on meds if he continues to lose wt   Will review labs today and determine what med to back off of first.   Most likely it will be maxzide or losartan (1/2)    2  Dyspnea   Improved with wt loss  Congratulated him on this  .     3.  Dyslipidemia.  Will have labs at Dr Danna Hefty office  4 Obesity   The pt has done a great job at [employer] and losing wt   Feels good   Glad he is going to keep working at it    [employer], TRE    CHeck BMET today Plan for f/u in the summer to  reassess BP   \  Set to see him in November.  Again I will be in touch with Dr. Randye Lobo his office Current medicines are reviewed at length with the patient today.  The patient does not have concerns regarding medicines.  Signed, Dorris Carnes, MD   07/16/2020 11:03 PM    [facility], [place], [place]  [address] Phone: [phone]; Fax: [phone]

## 2020-07-16 ENCOUNTER — Ambulatory Visit: Payer: Medicare Other | Admitting: Internal Medicine

## 2020-07-16 ENCOUNTER — Encounter: Payer: Self-pay | Admitting: Internal Medicine

## 2020-07-16 ENCOUNTER — Other Ambulatory Visit: Payer: Self-pay | Admitting: Internal Medicine

## 2020-07-16 ENCOUNTER — Other Ambulatory Visit: Payer: Self-pay

## 2020-07-16 VITALS — BP 130/70 | HR 74 | Ht 73.0 in | Wt 267.4 lb

## 2020-07-16 DIAGNOSIS — I10 Essential (primary) hypertension: Secondary | ICD-10-CM | POA: Diagnosis not present

## 2020-07-16 LAB — BASIC METABOLIC PANEL
BUN/Creatinine Ratio: 16 (ref 10–24)
BUN: 20 mg/dL (ref 8–27)
CO2: 22 mmol/L (ref 20–29)
Calcium: 9.5 mg/dL (ref 8.6–10.2)
Chloride: 99 mmol/L (ref 96–106)
Creatinine, Ser: 1.29 mg/dL — ABNORMAL HIGH (ref 0.76–1.27)
GFR calc Af Amer: 60 mL/min/{1.73_m2} (ref >59)
GFR calc non Af Amer: 52 mL/min/{1.73_m2} — ABNORMAL LOW (ref >59)
Glucose: 113 mg/dL — ABNORMAL HIGH (ref 65–99)
Potassium: 4.5 mmol/L (ref 3.5–5.2)
Sodium: 136 mmol/L (ref 134–144)

## 2020-07-16 NOTE — Patient Instructions (Signed)
Medication Instructions:  Your physician recommends that you continue on your current medications as directed. Please refer to the Current Medication list given to you today.  *If you need a refill on your cardiac medications before your next appointment, please call your pharmacy*   Lab Work: Lab work to be done today--BMP If you have labs (blood work) drawn today and your tests are completely normal, you will receive your results only by: Marland Kitchen MyChart Message (if you have MyChart) OR . A paper copy in the mail If you have any lab test that is abnormal or we need to change your treatment, we will call you to review the results.   Testing/Procedures: none   Follow-Up: At [facility], you and your health needs are our priority.  As part of our continuing mission to provide you with exceptional heart care, we have created designated Provider Care Teams.  These Care Teams include your primary Cardiologist (physician) and Advanced Practice Providers (APPs -  Physician Assistants and Nurse Practitioners) who all work together to provide you with the care you need, when you need it.  We recommend signing up for the patient portal called "MyChart".  Sign up information is provided on this After Visit Summary.  MyChart is used to connect with patients for Virtual Visits (Telemedicine).  Patients are able to view lab/test results, encounter notes, upcoming appointments, etc.  Non-urgent messages can be sent to your provider as well.   To learn more about what you can do with MyChart, go to [email or web address].    Your next appointment:   5 month(s)--July  The format for your next appointment:   In Person  Provider:   You may see Dorris Carnes, MD or one of the following Advanced Practice Providers on your designated Care Team:    Richardson Dopp, PA-C  Robbie Lis, [place]    Other Instructions

## 2020-07-20 ENCOUNTER — Other Ambulatory Visit: Payer: Self-pay | Admitting: Internal Medicine

## 2020-08-06 ENCOUNTER — Other Ambulatory Visit: Payer: Self-pay | Admitting: Internal Medicine

## 2021-01-23 ENCOUNTER — Encounter (HOSPITAL_BASED_OUTPATIENT_CLINIC_OR_DEPARTMENT_OTHER): Payer: Self-pay | Admitting: Family

## 2021-01-23 ENCOUNTER — Other Ambulatory Visit: Payer: Self-pay

## 2021-01-23 ENCOUNTER — Ambulatory Visit (HOSPITAL_BASED_OUTPATIENT_CLINIC_OR_DEPARTMENT_OTHER): Payer: Medicare Other | Admitting: Family

## 2021-01-23 VITALS — BP 114/68 | HR 65 | Ht 73.0 in | Wt 261.0 lb

## 2021-01-23 DIAGNOSIS — E782 Mixed hyperlipidemia: Secondary | ICD-10-CM

## 2021-01-23 DIAGNOSIS — I10 Essential (primary) hypertension: Secondary | ICD-10-CM | POA: Diagnosis not present

## 2021-01-23 MED ORDER — OLMESARTAN MEDOXOMIL 20 MG PO TABS: 20.0000 mg | ORAL_TABLET | Freq: Every day | ORAL | 1 refills | Status: DC

## 2021-01-23 NOTE — Patient Instructions (Signed)
Medication Instructions:  Your physician has recommended you make the following change in your medication:   CHANGE Olmesartan to one '20mg'$  tablet daily *You may use up your '40mg'$  tablets by taking a half-tablet daily  If you have recurrent low blood pressure (consistently <120) we will plan to stop your Amlodipine. Simply call our office if this occurs.   *If you need a refill on your cardiac medications before your next appointment, please call your pharmacy*   Lab Work: None ordered today.   Testing/Procedures: None ordered today.   Follow-Up: At [facility], you and your health needs are our priority.  As part of our continuing mission to provide you with exceptional heart care, we have created designated Provider Care Teams.  These Care Teams include your primary Cardiologist (physician) and Advanced Practice Providers (APPs -  Physician Assistants and Nurse Practitioners) who all work together to provide you with the care you need, when you need it.  We recommend signing up for the patient portal called "MyChart".  Sign up information is provided on this After Visit Summary.  MyChart is used to connect with patients for Virtual Visits (Telemedicine).  Patients are able to view lab/test results, encounter notes, upcoming appointments, etc.  Non-urgent messages can be sent to your provider as well.   To learn more about what you can do with MyChart, go to [email or web address].    Your next appointment:   07/24/2021 at 10:40AM with Dr. Harrington Challenger   Other Instructions  Heart Healthy Diet Recommendations: A low-salt diet is recommended. Meats should be grilled, baked, or boiled. Avoid fried foods. Focus on lean protein sources like fish or chicken with vegetables and fruits. The American Heart Association is a Microbiologist!  Exercise recommendations: The American Heart Association recommends 150 minutes of moderate intensity exercise weekly. Try 30 minutes of moderate intensity  exercise 4-5 times per week. This could include walking, jogging, or swimming.

## 2021-01-23 NOTE — Progress Notes (Signed)
Office Visit    Patient Name: Shawn Keller Date of Encounter: 01/23/2021  PCP:  Prince Solian, [facility]  Cardiologist:  Dorris Carnes, MD  Advanced Practice Provider:  No care team member to display Electrophysiologist:  None      Chief Complaint    Shawn Keller is a 80 y.o. male with a hx of hypertension, hyperlipidemia presents today for follow-up  Past Medical History    Past Medical History:  Diagnosis Date   Allergy    Arthritis    Cataract    Cellulitis of lower leg    15 yrs ago   Gout    Hyperlipidemia    Hypertension    hypercholesterolemia   LOV Dr AVA  with clearance 9/12,  EKG and CHEST  XRAY on chart   Measles    Childhood Illness   Mumps    Childhood Illness   Pneumonia    hx of x 2    Renal calculi    kidney stones, nocturia   Seasonal allergies    Shingles    Statin myopathy    History of Statin Myalgias   Past Surgical History:  Procedure Laterality Date   LITHOTRIPSY  2010   for kidney stone   TONSILLECTOMY  1946   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 male with a hx of hypertension, hyperlipidemia last seen 07/16/2020 by Dr. Harrington Challenger.  He has previously had to have doses of carvedilol reduced due to bradycardia.  Previous lower extremity edema improved with as needed Lasix.  He was seen January 2021 noting fatigue and significant difficulties with spinal stenosis.  He was recommended for Myoview but did not have this completed.  February 2020 when he  was seen in clinic and doing well.  He was most recently seen 07/16/2020 by Dr. Harrington Challenger feeling better than he had in some time.  Did note that blood pressures been 100s to 130s though was asymptomatic and recommended to monitor carefully.  It was expected that he would need to reduce blood pressure medication in the future as he continued weight loss.  He presents today for follow-up. Has continued to work on healthy lifestyle choices since last seen. He has started riding his bicycle outdoors recently. He goes to the gym three times per week. He endorses following a heart healthy diet. In June he did have hypotension with lightheadedness and home BP 90s/60s. This improved with reduced dose of Olmesartan to '20mg'$  daily. More recently, BP at home has been 120s/60s. No recurrent lightheadedness. Reports no shortness of breath nor dyspnea on exertion. Reports no chest pain, pressure, or tightness. No edema, orthopnea, PND. Reports no palpitations.    EKGs/Labs/Other Studies Reviewed:   The following studies were reviewed today:   EKG:  No EKG today.   Recent Labs: 07/16/2020: BUN 20; Creatinine, Ser 1.29; Potassium 4.5; Sodium 136  Recent Lipid Panel No results found for: CHOL, TRIG, HDL,  CHOLHDL, VLDL, LDLCALC, LDLDIRECT  Home Medications   Current Meds  Medication Sig   allopurinol (ZYLOPRIM) 300 MG tablet Take 300 mg by mouth daily.   amLODipine (NORVASC) 5 MG tablet Take 1 tablet (5 mg total) by mouth daily.   amoxicillin (AMOXIL) 500 MG capsule Take four (4) capsules by mouth as needed one (1) hour prior to dental procedures.   aspirin EC 81 MG tablet Take 81 mg by mouth daily.   carvedilol (COREG) 6.25 MG tablet TAKE 1 TABLET BY MOUTH TWICE A DAY   Cyanocobalamin (VITAMIN B-12 CR) 1500 MCG TBCR Take 1 tablet by mouth daily.   gabapentin (NEURONTIN) 100 MG capsule SMARTSIG:1 Capsule(s) By Mouth 1 to 3 Times Daily PRN   GLUCOSAMINE-CHONDROITIN PO Take 2 tablets by mouth daily.   loratadine  (CLARITIN) 10 MG tablet Take 10 mg by mouth daily. During March and April   olmesartan (BENICAR) 20 MG tablet Take 1 tablet (20 mg total) by mouth daily.   pravastatin (PRAVACHOL) 40 MG tablet Take 40 mg by mouth daily.   triamterene-hydrochlorothiazide (MAXZIDE-25) 37.5-25 MG tablet TAKE 1/2 TABLET BY MOUTH EVERY DAY   [DISCONTINUED] losartan (COZAAR) 100 MG tablet Take 1 tablet by mouth daily.   [DISCONTINUED] olmesartan (BENICAR) 40 MG tablet Take 20 mg by mouth daily.     Review of Systems      All other systems reviewed and are otherwise negative except as noted above.  Physical Exam    VS:  BP 114/68   Pulse 65   Ht '6\' 1"'$  (1.854 m)   Wt 261 lb (118.4 kg)   BMI 34.43 kg/m  , BMI Body mass index is 34.43 kg/m.  Wt Readings from Last 3 Encounters:  01/23/21 261 lb (118.4 kg)  07/16/20 267 lb 6.4 oz (121.3 kg)  06/27/19 (!) 303 lb 12.8 oz (137.8 kg)     GEN: Well nourished, well developed, in no acute distress. HEENT: normal. Neck: Supple, no JVD, carotid bruits, or masses. Cardiac: RRR, no murmurs, rubs, or gallops. No clubbing, cyanosis, edema.  Radials/PT 2+ and equal bilaterally.  Respiratory:  Respirations regular and unlabored, clear to auscultation bilaterally. GI: Soft, nontender, nondistended. MS: No deformity or atrophy. Skin: Warm and dry, no rash. Neuro:  Strength and sensation are intact. Psych: Normal affect.  Assessment & Plan    HTN - BP well controlled. Continue current antihypertensive regimen including Olmesartan '20mg'$  QD, Coreg 6.'25mg'$  BID, AMlodipine '5mg'$  QD, Triamterene-HCTZ 37.25-'25mg'$  half tablet daily. He is currently taking half of a Olmesartan '40mg'$  tablet, will send Rx for '20mg'$  dose. We discussed that if he has recurrent hypotension with BP consistently <120/60 will plan to discontinue Amlodipine. Heart healthy diet and regular cardiovascular exercise encouraged.    Dyslipidemia - Continue Pravastatin '40mg'$  daily.   Obesity - Congratulated on  continued weight loss. He is very active on his bicycle and at the gym.   Disposition: Follow up in 6 month(s) with Dr. Harrington Challenger or APP.  Signed, Loel Dubonnet, NP 01/23/2021, 12:21 PM [place]

## 2021-01-30 ENCOUNTER — Other Ambulatory Visit: Payer: Self-pay | Admitting: Internal Medicine

## 2021-07-10 ENCOUNTER — Other Ambulatory Visit: Payer: Self-pay | Admitting: Internal Medicine

## 2021-07-10 DIAGNOSIS — I10 Essential (primary) hypertension: Secondary | ICD-10-CM

## 2021-07-22 NOTE — Progress Notes (Signed)
? ?Cardiology Office Note ? ? ?Date:  07/24/2021  ? ?ID:  Shawn Keller, DOB 04-Apr-1941, MRN [id number] ? ?PCP:  Prince Solian, MD  ?Cardiologist:   Dorris Carnes, MD  ? ?F/U of HTN   ?  ?History of Present Illness: ?Shawn Keller is a 81 y.o. male with a history of HTN and HL He is followed by R [facility]  The pt lost about 40 lbs at start of pandemic   He has felt much better since    H ?I saw the patient in clinic in Feb 2022   During summer 2022 he had low BP  Cut back on meds    No dizziness    ?Seen by Vella Raring in late summer 2022     ? ?Doing wll   No CP  No dizziness   no SOB   Active    ? ? ?Outpatient Medications Prior to Visit  ?Medication Sig Dispense Refill  ? allopurinol (ZYLOPRIM) 300 MG tablet Take 300 mg by mouth daily.    ? amLODipine (NORVASC) 5 MG tablet TAKE 1 TABLET (5 MG TOTAL) BY MOUTH DAILY. 90 tablet 2  ? amoxicillin (AMOXIL) 500 MG capsule Take four (4) capsules by mouth as needed one (1) hour prior to dental procedures.  0  ? aspirin EC 81 MG tablet Take 81 mg by mouth daily.    ? carvedilol (COREG) 6.25 MG tablet TAKE 1 TABLET BY MOUTH TWICE A DAY 180 tablet 3  ? Cyanocobalamin (VITAMIN B-12 CR) 1500 MCG TBCR Take 1 tablet by mouth daily.    ? furosemide (LASIX) 20 MG tablet TAKE 1 TABLET (20 MG TOTAL) BY MOUTH DAILY AS NEEDED (SWELLING). 90 tablet 2  ? gabapentin (NEURONTIN) 100 MG capsule SMARTSIG:1 Capsule(s) By Mouth 1 to 3 Times Daily PRN    ? GLUCOSAMINE-CHONDROITIN PO Take 2 tablets by mouth daily.    ? loratadine (CLARITIN) 10 MG tablet Take 10 mg by mouth daily. During March and April    ? olmesartan (BENICAR) 20 MG tablet Take 1 tablet (20 mg total) by mouth daily. 90 tablet 1  ? triamterene-hydrochlorothiazide (MAXZIDE-25) 37.5-25 MG tablet TAKE 1/2 TABLET BY MOUTH EVERY DAY 45 tablet 2  ? pravastatin (PRAVACHOL) 40 MG tablet Take 40 mg by mouth daily.    ? ?No facility-administered medications prior to visit.  ? ? ? ?Allergies:   Atorvastatin and Other  ? ?Past Medical  History:  ?Diagnosis Date  ? Allergy   ? Arthritis   ? Cataract   ? Cellulitis of lower leg   ? 15 yrs ago  ? Gout   ? Hyperlipidemia   ? Hypertension   ? hypercholesterolemia   LOV Dr AVA  with clearance 9/12,  EKG and CHEST  XRAY on chart  ? Measles   ? Childhood Illness  ? Mumps   ? Childhood Illness  ? Pneumonia   ? hx of x 2   ? Renal calculi   ? kidney stones, nocturia  ? Seasonal allergies   ? Shingles   ? Statin myopathy   ? History of Statin Myalgias  ? ? ?Past Surgical History:  ?Procedure Laterality Date  ? LITHOTRIPSY  2010  ? for kidney stone  ? TONSILLECTOMY  1946  ? 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 Procedure: TOTAL KNEE ARTHROPLASTY;  Surgeon: Gearlean Alf;  Location: [facility];  Service: Orthopedics;  Laterality: Left;  ? 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 Procedure: TOTAL  KNEE ARTHROPLASTY;  Surgeon: Gearlean Alf, MD;  Location: [facility];  Service: Orthopedics;  Laterality: Right;  ? UMBILICAL HERNIA REPAIR  2010  ? ? ? ?Social History:  The patient  reports that he quit smoking about 42 years ago. His smoking use included cigarettes. He has a 10.00 pack-year smoking history. He has never used smokeless tobacco. He reports current alcohol use of about 2.0 standard drinks per week. He reports that he does not use drugs.  ? ?Family History:  The patient's family history includes Diabetes in his brother; Healthy in his brother and sister; Heart attack in his brother; Hypertension in his mother; Stroke in his father.  ? ? ?ROS:  Please see the history of present illness. All other systems are reviewed and  Negative to the above problem except as noted.  ? ? ?PHYSICAL EXAM: ?VS:  BP 120/64   Pulse 68   Ht 6\' 1"  (1.854 m)   Wt 268 lb 6.4 oz (121.7 kg)   SpO2 94%   BMI 35.41 kg/m?   ? ? ?GEN:  Obese 81 yo  in no acute distress  ?HEENT: normal  ?Neck: no JVD  No bruits. ?Cardiac: RRR; no murmurs  No LE  edema  ?Respiratory: CTA   ?GI: soft, nontender, + BS   ?MS: no deformity Moving all  extremities   ?Skin: warm and dry, no rash ?Neuro:  Strength and sensation are intact ?Psych: euthymic mood, full affect ? ? ?EKG:  EKG is ordered today    NSR   First degree AV block  PR 272 msec ? ?Lipid Panel ?No results found for: CHOL, TRIG, HDL, CHOLHDL, VLDL, LDLCALC, LDLDIRECT ?  ? ?Wt Readings from Last 3 Encounters:  ?07/24/21 268 lb 6.4 oz (121.7 kg)  ?01/23/21 261 lb (118.4 kg)  ?07/16/20 267 lb 6.4 oz (121.3 kg)  ?  ? ? ?ASSESSMENT AND PLAN: ? ?1  HTN  BP is well controlled on current regimne   Continue     ? ?2  Dyspnea   Improved with wt loss  Congratulated him on this  . ? ?3.  Dyslipidemia.  LDL is 108   he had an Lpa  in past that was 175    Iwould recomm more aggressive lowering   Would swithc to Crestor    He had problems with lipitor but metabolism is very different ?Will geti lipomed in 8 wks to see response  ? ?4 Obesity   Limit carbs   CHeck BMET today ?Plan for f/u in the summer to reassess BP   ? ? ?Set to see him in about 9 months ? ?Current medicines are reviewed at length with the patient today.  The patient does not have concerns regarding medicines. ? ?Signed, ?Dorris Carnes, MD  ?07/24/2021 11:29 AM    ?[facility] ?[place], [place], [place]  [address] ?Phone: [phone]; Fax: [phone]  ? ? ?

## 2021-07-24 ENCOUNTER — Encounter: Payer: Self-pay | Admitting: Internal Medicine

## 2021-07-24 ENCOUNTER — Other Ambulatory Visit: Payer: Self-pay

## 2021-07-24 ENCOUNTER — Ambulatory Visit: Payer: Medicare Other | Admitting: Internal Medicine

## 2021-07-24 VITALS — BP 120/64 | HR 68 | Ht 73.0 in | Wt 268.4 lb

## 2021-07-24 DIAGNOSIS — Z79899 Other long term (current) drug therapy: Secondary | ICD-10-CM

## 2021-07-24 DIAGNOSIS — E782 Mixed hyperlipidemia: Secondary | ICD-10-CM

## 2021-07-24 MED ORDER — ROSUVASTATIN CALCIUM 20 MG PO TABS: 20.0000 mg | ORAL_TABLET | Freq: Every day | ORAL | 3 refills | Status: DC

## 2021-07-24 NOTE — Patient Instructions (Addendum)
Medication Instructions:  ?Crestor 20 mg daily ?Stop Pravastatin  ?*If you need a refill on your cardiac medications before your next appointment, please call your pharmacy* ? ? ?Lab Work: ?NMR in 8 weeks approx 09/25/2021 fasting lab opens at 7:30 am  ?If you have labs (blood work) drawn today and your tests are completely normal, you will receive your results only by: ?MyChart Message (if you have MyChart) OR ?A paper copy in the mail ?If you have any lab test that is abnormal or we need to change your treatment, we will call you to review the results. ? ? ?Testing/Procedures: ?none ? ? ?Follow-Up: ?At [facility], you and your health needs are our priority.  As part of our continuing mission to provide you with exceptional heart care, we have created designated Provider Care Teams.  These Care Teams include your primary Cardiologist (physician) and Advanced Practice Providers (APPs -  Physician Assistants and Nurse Practitioners) who all work together to provide you with the care you need, when you need it. ? ?We recommend signing up for the patient portal called "MyChart".  Sign up information is provided on this After Visit Summary.  MyChart is used to connect with patients for Virtual Visits (Telemedicine).  Patients are able to view lab/test results, encounter notes, upcoming appointments, etc.  Non-urgent messages can be sent to your provider as well.   ?To learn more about what you can do with MyChart, go to [email or web address].   ? ?Your next appointment:   ?9 month(s) ? ?The format for your next appointment:   ?In Person ? ?Provider:   ?Dorris Carnes, MD   ? ? ?Other Instructions ?  ?

## 2021-09-25 ENCOUNTER — Other Ambulatory Visit: Payer: Medicare Other | Admitting: *Deleted

## 2021-09-25 DIAGNOSIS — E782 Mixed hyperlipidemia: Secondary | ICD-10-CM

## 2021-09-25 DIAGNOSIS — Z79899 Other long term (current) drug therapy: Secondary | ICD-10-CM

## 2021-09-26 LAB — NMR, LIPOPROFILE
Cholesterol, Total: 87 mg/dL — ABNORMAL LOW (ref 100–199)
HDL Particle Number: 21.3 umol/L — ABNORMAL LOW (ref >=30.5)
HDL-C: 28 mg/dL — ABNORMAL LOW (ref >39)
LDL Particle Number: 644 nmol/L (ref <1000)
LDL Size: 20.2 nm — ABNORMAL LOW (ref >20.5)
LDL-C (NIH Calc): 43 mg/dL (ref 0–99)
LP-IR Score: 55 — ABNORMAL HIGH (ref <=45)
Small LDL Particle Number: 367 nmol/L (ref <=527)
Triglycerides: 75 mg/dL (ref 0–149)

## 2021-12-03 ENCOUNTER — Other Ambulatory Visit: Payer: Self-pay | Admitting: Internal Medicine

## 2022-01-13 ENCOUNTER — Other Ambulatory Visit: Payer: Self-pay

## 2022-01-13 ENCOUNTER — Other Ambulatory Visit: Payer: Self-pay | Admitting: Internal Medicine

## 2022-01-13 DIAGNOSIS — I10 Essential (primary) hypertension: Secondary | ICD-10-CM

## 2022-03-17 ENCOUNTER — Other Ambulatory Visit (HOSPITAL_BASED_OUTPATIENT_CLINIC_OR_DEPARTMENT_OTHER): Payer: Self-pay | Admitting: Family

## 2022-03-17 DIAGNOSIS — I10 Essential (primary) hypertension: Secondary | ICD-10-CM

## 2022-03-17 NOTE — Telephone Encounter (Signed)
Rx(s) sent to pharmacy electronically.  

## 2022-05-10 NOTE — Progress Notes (Unsigned)
Cardiology Office Note   Date:  05/13/2022   ID:  Shawn Keller, DOB 1940-11-06, MRN [id number]  PCP:  Prince Solian, MD  Cardiologist:   Dorris Carnes, MD   F/U of HTN     History of Present Illness: Shawn Keller is a 81 y.o. male with a history of HTN  HL  and obesity   He is followed by R [facility]  The pt lost about 40 lbs at start of pandemic   I saw the patient in clinic in Feb 2022   During summer 2022 he had low BP  Cut back on meds    No dizziness      I saw the pt in March 2023   The pt denies CP  Breathing is good   He is active outside  Says he notes occasional dizziness with rapid changes in position   Wonders if it is because his BP is too low     Lipomed in May 2023 LDL 43  HDL 28  Trig 75   Outpatient Medications Prior to Visit  Medication Sig Dispense Refill   allopurinol (ZYLOPRIM) 300 MG tablet Take 300 mg by mouth daily.     amLODipine (NORVASC) 5 MG tablet TAKE 1 TABLET (5 MG TOTAL) BY MOUTH DAILY. 90 tablet 2   amoxicillin (AMOXIL) 500 MG capsule Take four (4) capsules by mouth as needed one (1) hour prior to dental procedures.  0   aspirin EC 81 MG tablet Take 81 mg by mouth daily.     carvedilol (COREG) 6.25 MG tablet TAKE 1 TABLET BY MOUTH TWICE A DAY 180 tablet 1   Cyanocobalamin (VITAMIN B-12 CR) 1500 MCG TBCR Take 1 tablet by mouth daily.     gabapentin (NEURONTIN) 300 MG capsule Take 300 mg by mouth 3 (three) times daily.     GLUCOSAMINE-CHONDROITIN PO Take 2 tablets by mouth daily.     LAGEVRIO 200 MG CAPS capsule SMARTSIG:4 Capsule(s) By Mouth Every 12 Hours     loratadine (CLARITIN) 10 MG tablet Take 10 mg by mouth daily. During March and April     olmesartan (BENICAR) 20 MG tablet TAKE 1 TABLET BY MOUTH EVERY DAY 90 tablet 1   OZEMPIC, 0.25 OR 0.5 MG/DOSE, 2 MG/3ML SOPN Inject 0.5 mg into the skin once a week.     pravastatin (PRAVACHOL) 40 MG tablet Take 40 mg by mouth daily.     rosuvastatin (CRESTOR) 20 MG tablet Take 20 mg by mouth  daily.     triamterene-hydrochlorothiazide (MAXZIDE-25) 37.5-25 MG tablet TAKE 1/2 TABLET BY MOUTH EVERY DAY 45 tablet 2   furosemide (LASIX) 20 MG tablet TAKE 1 TABLET (20 MG TOTAL) BY MOUTH DAILY AS NEEDED (SWELLING). (Patient not taking: Reported on 05/13/2022) 90 tablet 2   No facility-administered medications prior to visit.     Allergies:   Atorvastatin and Other   Past Medical History:  Diagnosis Date   Allergy    Arthritis    Cataract    Cellulitis of lower leg    15 yrs ago   Gout    Hyperlipidemia    Hypertension    hypercholesterolemia   LOV Dr AVA  with clearance 9/12,  EKG and CHEST  XRAY on chart   Measles    Childhood Illness   Mumps    Childhood Illness   Pneumonia    hx of x 2    Renal calculi    kidney stones, nocturia  Seasonal allergies    Shingles    Statin myopathy    History of Statin Myalgias    Past Surgical History:  Procedure Laterality Date   LITHOTRIPSY  2010   for kidney stone   TONSILLECTOMY  1946   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 His smoking use included cigarettes. He has a 10.00 pack-year smoking history. He has never used smokeless tobacco. He reports current alcohol use of about 2.0 standard drinks of alcohol per week. He reports that he does not use drugs.   Family History:  The patient's family history includes Diabetes in his brother; Healthy in his brother and sister; Heart attack in his brother; Hypertension in his mother; Stroke in his father.    ROS:  Please see the history of present illness. All other systems are  reviewed and  Negative to the above problem except as noted.    PHYSICAL EXAM: VS:  BP (!) 100/50 (BP Location: Left Arm, Patient Position: Sitting, Cuff Size: Normal)   Pulse 78   Ht '6\' 1"'$  (1.854 m)   Wt 250 lb (113.4 kg)   SpO2 98%   BMI 32.98 kg/m     GEN:  Obese 81 yo  in no acute distress  HEENT: normal  Neck: no JVD  No carotid bruit Cardiac: RRR; no murmurs  No LE  edema  Respiratory: CTA   GI: soft, nontender, + BS   MS: no deformity Moving all extremities   Skin: warm and dry, no rash Neuro:  Strength and sensation are intact Psych: euthymic mood, full affect   EKG:  EKG is not ordered   Lipid Panel No results found for: "CHOL", "TRIG", "HDL", "CHOLHDL", "VLDL", "LDLCALC", "LDLDIRECT"    Wt Readings from Last 3 Encounters:  05/13/22 250 lb (113.4 kg)  07/24/21 268 lb 6.4 oz (121.7 kg)  01/23/21 261 lb (118.4 kg)      ASSESSMENT AND PLAN:  1  HTN  BP is 100/  He notes occasional dizzienss at times   Cut back on amlodipine to 2.5 mg   Follow   May be able to cut back further,either off of amlodipine or down on carvedidlol    he will call back    2.  Dyslipidemia.  he had an Lpa  in past that was 175  LDL was 61  Keep on Crestor  Check lipomed     3  Obesty  On Ozempic     Congratualted on weight loss       Check BMET, lipomed, A1C     Follow up in Sept/Oct    Current medicines are reviewed at length with the patient today.  The patient does not have concerns regarding medicines.  Signed, Dorris Carnes, MD  05/13/2022 8:35 AM    [address], [place], [place]  [address] Phone: [phone]; Fax: [phone]

## 2022-05-13 ENCOUNTER — Encounter: Payer: Self-pay | Admitting: Internal Medicine

## 2022-05-13 ENCOUNTER — Ambulatory Visit: Payer: Medicare Other | Attending: Internal Medicine | Admitting: Internal Medicine

## 2022-05-13 VITALS — BP 100/50 | HR 78 | Ht 73.0 in | Wt 250.0 lb

## 2022-05-13 DIAGNOSIS — I10 Essential (primary) hypertension: Secondary | ICD-10-CM

## 2022-05-13 DIAGNOSIS — E782 Mixed hyperlipidemia: Secondary | ICD-10-CM | POA: Diagnosis not present

## 2022-05-13 DIAGNOSIS — Z79899 Other long term (current) drug therapy: Secondary | ICD-10-CM | POA: Diagnosis not present

## 2022-05-13 MED ORDER — AMLODIPINE BESYLATE 2.5 MG PO TABS: 2.5000 mg | ORAL_TABLET | Freq: Every day | ORAL | 3 refills | Status: DC

## 2022-05-13 NOTE — Patient Instructions (Signed)
Medication Instructions:  Decrease Amlodipine 2.5 mg daily  *If you need a refill on your cardiac medications before your next appointment, please call your pharmacy*   Lab Work: HGBA1C, BMET, NMR TODAY  If you have labs (blood work) drawn today and your tests are completely normal, you will receive your results only by: [place] (if you have MyChart) OR A paper copy in the mail If you have any lab test that is abnormal or we need to change your treatment, we will call you to review the results.   Testing/Procedures:    Follow-Up: At [facility], you and your health needs are our priority.  As part of our continuing mission to provide you with exceptional heart care, we have created designated Provider Care Teams.  These Care Teams include your primary Cardiologist (physician) and Advanced Practice Providers (APPs -  Physician Assistants and Nurse Practitioners) who all work together to provide you with the care you need, when you need it.  We recommend signing up for the patient portal called "MyChart".  Sign up information is provided on this After Visit Summary.  MyChart is used to connect with patients for Virtual Visits (Telemedicine).  Patients are able to view lab/test results, encounter notes, upcoming appointments, etc.  Non-urgent messages can be sent to your provider as well.   To learn more about what you can do with MyChart, go to [email or web address].    Your next appointment:   10 MONTHS  The format for your next appointment:   In Person  Provider:   Dorris Carnes, MD     Other Instructions   Important Information About Sugar

## 2022-05-14 LAB — BASIC METABOLIC PANEL
BUN/Creatinine Ratio: 18 (ref 10–24)
BUN: 26 mg/dL (ref 8–27)
CO2: 25 mmol/L (ref 20–29)
Calcium: 9.9 mg/dL (ref 8.6–10.2)
Chloride: 104 mmol/L (ref 96–106)
Creatinine, Ser: 1.41 mg/dL — ABNORMAL HIGH (ref 0.76–1.27)
Glucose: 90 mg/dL (ref 70–99)
Potassium: 4.9 mmol/L (ref 3.5–5.2)
Sodium: 141 mmol/L (ref 134–144)
eGFR: 50 mL/min/{1.73_m2} — ABNORMAL LOW (ref >59)

## 2022-05-14 LAB — HEMOGLOBIN A1C
Est. average glucose Bld gHb Est-mCnc: 114 mg/dL
Hgb A1c MFr Bld: 5.6 % (ref 4.8–5.6)

## 2022-05-14 LAB — NMR, LIPOPROFILE
Cholesterol, Total: 103 mg/dL (ref 100–199)
HDL Particle Number: 25 umol/L — ABNORMAL LOW (ref >=30.5)
HDL-C: 34 mg/dL — ABNORMAL LOW (ref >39)
LDL Particle Number: 543 nmol/L (ref <1000)
LDL Size: 20.5 nm — ABNORMAL LOW (ref >20.5)
LDL-C (NIH Calc): 51 mg/dL (ref 0–99)
LP-IR Score: 56 — ABNORMAL HIGH (ref <=45)
Small LDL Particle Number: 235 nmol/L (ref <=527)
Triglycerides: 89 mg/dL (ref 0–149)

## 2022-07-13 ENCOUNTER — Other Ambulatory Visit: Payer: Self-pay | Admitting: Internal Medicine

## 2022-07-21 ENCOUNTER — Other Ambulatory Visit: Payer: Self-pay | Admitting: Internal Medicine

## 2022-09-12 ENCOUNTER — Other Ambulatory Visit (HOSPITAL_BASED_OUTPATIENT_CLINIC_OR_DEPARTMENT_OTHER): Payer: Self-pay | Admitting: Internal Medicine

## 2022-09-12 DIAGNOSIS — I10 Essential (primary) hypertension: Secondary | ICD-10-CM

## 2022-09-21 ENCOUNTER — Encounter (HOSPITAL_BASED_OUTPATIENT_CLINIC_OR_DEPARTMENT_OTHER): Payer: Self-pay | Admitting: Emergency Medicine

## 2022-09-21 ENCOUNTER — Emergency Department (HOSPITAL_BASED_OUTPATIENT_CLINIC_OR_DEPARTMENT_OTHER): Payer: Medicare Other | Admitting: Radiology

## 2022-09-21 ENCOUNTER — Emergency Department (HOSPITAL_BASED_OUTPATIENT_CLINIC_OR_DEPARTMENT_OTHER): Payer: Medicare Other

## 2022-09-21 ENCOUNTER — Other Ambulatory Visit: Payer: Self-pay

## 2022-09-21 ENCOUNTER — Emergency Department (HOSPITAL_COMMUNITY): Payer: Medicare Other

## 2022-09-21 ENCOUNTER — Emergency Department (HOSPITAL_BASED_OUTPATIENT_CLINIC_OR_DEPARTMENT_OTHER)
Admission: EM | Admit: 2022-09-21 | Discharge: 2022-09-21 | Disposition: A | Discharge status: Home / Self Care | Payer: Medicare Other | Attending: Emergency Medicine | Admitting: Emergency Medicine

## 2022-09-21 DIAGNOSIS — R079 Chest pain, unspecified: Secondary | ICD-10-CM

## 2022-09-21 DIAGNOSIS — Z96653 Presence of artificial knee joint, bilateral: Secondary | ICD-10-CM | POA: Insufficient documentation

## 2022-09-21 DIAGNOSIS — R002 Palpitations: Secondary | ICD-10-CM | POA: Diagnosis not present

## 2022-09-21 DIAGNOSIS — I10 Essential (primary) hypertension: Secondary | ICD-10-CM | POA: Insufficient documentation

## 2022-09-21 DIAGNOSIS — R0789 Other chest pain: Secondary | ICD-10-CM | POA: Insufficient documentation

## 2022-09-21 DIAGNOSIS — R5383 Other fatigue: Secondary | ICD-10-CM | POA: Insufficient documentation

## 2022-09-21 DIAGNOSIS — Z7982 Long term (current) use of aspirin: Secondary | ICD-10-CM | POA: Diagnosis not present

## 2022-09-21 DIAGNOSIS — H5340 Unspecified visual field defects: Secondary | ICD-10-CM

## 2022-09-21 DIAGNOSIS — Z79899 Other long term (current) drug therapy: Secondary | ICD-10-CM | POA: Insufficient documentation

## 2022-09-21 DIAGNOSIS — H53451 Other localized visual field defect, right eye: Secondary | ICD-10-CM | POA: Insufficient documentation

## 2022-09-21 DIAGNOSIS — R202 Paresthesia of skin: Secondary | ICD-10-CM | POA: Insufficient documentation

## 2022-09-21 DIAGNOSIS — R200 Anesthesia of skin: Secondary | ICD-10-CM | POA: Diagnosis present

## 2022-09-21 LAB — CBC
HCT: 37.6 % — ABNORMAL LOW (ref 39.0–52.0)
Hemoglobin: 12.5 g/dL — ABNORMAL LOW (ref 13.0–17.0)
MCH: 32 pg (ref 26.0–34.0)
MCHC: 33.2 g/dL (ref 30.0–36.0)
MCV: 96.2 fL (ref 80.0–100.0)
Platelets: 170 10*3/uL (ref 150–400)
RBC: 3.91 MIL/uL — ABNORMAL LOW (ref 4.22–5.81)
RDW: 13.9 % (ref 11.5–15.5)
WBC: 4.1 10*3/uL (ref 4.0–10.5)
nRBC: 0 % (ref 0.0–0.2)

## 2022-09-21 LAB — BRAIN NATRIURETIC PEPTIDE: B Natriuretic Peptide: 35.7 pg/mL (ref 0.0–100.0)

## 2022-09-21 LAB — TROPONIN I (HIGH SENSITIVITY)
Troponin I (High Sensitivity): 6 ng/L (ref <18)
Troponin I (High Sensitivity): 6 ng/L (ref <18)

## 2022-09-21 LAB — BASIC METABOLIC PANEL
Anion gap: 9 (ref 5–15)
BUN: 28 mg/dL — ABNORMAL HIGH (ref 8–23)
CO2: 24 mmol/L (ref 22–32)
Calcium: 9.3 mg/dL (ref 8.9–10.3)
Chloride: 104 mmol/L (ref 98–111)
Creatinine, Ser: 1.4 mg/dL — ABNORMAL HIGH (ref 0.61–1.24)
GFR, Estimated: 50 mL/min — ABNORMAL LOW (ref >60)
Glucose, Bld: 176 mg/dL — ABNORMAL HIGH (ref 70–99)
Potassium: 3.9 mmol/L (ref 3.5–5.1)
Sodium: 137 mmol/L (ref 135–145)

## 2022-09-21 MED ORDER — IOHEXOL 350 MG/ML SOLN
100.0000 mL | Freq: Once | INTRAVENOUS | Status: AC | PRN
  Administered 2022-09-21: 100 mL via INTRAVENOUS

## 2022-09-21 NOTE — ED Provider Notes (Signed)
From outside hospital for need of an MRI.  The patient is 82 years old this patient arrives, has history of hypertension, acid reflux and diabetes, no prior history of stroke, he states that over the last week he has had some fluctuating symptoms including some transient numbness of the foot on the left side and a feeling of extra heartbeats and palpitations occasionally as well.  He is asymptomatic at this time and able to ambulate without difficulty.  Patient had negative workup at the outside hospital and was transferred here for MRI to make sure there is no other signs of acute ischemic stroke.  If the patient is cleared by MRI he can go home, if there is something positive on there showing acute stroke you will need to be admitted for the rest of the stroke workup.  I examined the patient and found no focal abnormal neurologic exam findings including no weakness no sensation deficits, normal finger-nose-finger, no pronator drift, normal speech and cranial nerves III through XII are normal.  He is agreeable to the plan   Eber Hong, MD 09/23/22 [phone]

## 2022-09-21 NOTE — ED Notes (Signed)
He has just left with [facility]. Asher Muir, RN, CN at [facility] is made aware.

## 2022-09-21 NOTE — ED Notes (Signed)
Patient transported to MRI 

## 2022-09-21 NOTE — ED Provider Notes (Signed)
Assumed care from Dr. Hyacinth Meeker.  Patient's MRI has now returned and shows no acute findings.  This was discussed with the patient.  He and his wife are comfortable going home and following up with Dr. Tenny Craw if he has any further palpitations and with his PCP if he continues to have the intermittent tingling in his leg.   Gwyneth Sprout, MD 09/21/22 1729

## 2022-09-21 NOTE — ED Triage Notes (Signed)
Pt arrives pov, steady gait, endorses heavy lifting at home x 2 days pta, endorses "irregular heart rhythm" after lifting. Reports resting, then yard work yesterday and "skipping beats" returned with LT foot numbness and LT scapula pain that worsens with movement. Pt denies CP or shob

## 2022-09-21 NOTE — ED Notes (Signed)
Called [facility] -- informed that pt needs Ed to Ed tx via [employer]; accepting, Eber Hong, MD

## 2022-09-21 NOTE — ED Notes (Signed)
He tells me he is comfortable. He ambulates as lib without difficulty.

## 2022-09-21 NOTE — ED Provider Notes (Signed)
[facility] AT [facility] Provider Note   CSN: [id number] Arrival date & time: 09/21/22  0753     History  Chief Complaint  Patient presents with   Palpitations    Shawn Keller is a 82 y.o. male.  HPI     82yo male with a history of htn, hlpd, obesity, who presents with concern for irregular heart rhythm, left side pain, left foot numbness.    Was heavy lifting 2 days aog, then had irregular heart rhythm, rested then did yard work yesterday and had skipping beats, returned with left foot numbness and left scapula pain that worsens with movement. Denies chest pain or dyspnea.   When hold arm out it hurt, left side pain. Heating pad put on felt better. During the night felt numbness in left foot, had numbness in toes before. Pain in side still there when moving arm certain ways. A little less steady on feet.  BP seemed to be ok.  Did feel like heart was missing beats. Not feeling short of breath, didn't have trouble breathing when moving around.  Foot doesn't feel numb now. Felt them mostly while at rest, not while doing things.  Denies other numbness, weakness, difficulty talking or walking, visual changes or facial droop.  Last week walked on a trail 45 minutes without any pain in legs, chest.   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 Authorizing Provider  allopurinol (ZYLOPRIM) 300 MG tablet Take 300 mg by mouth daily. 03/24/15   [provider]  amLODipine (NORVASC) 2.5 MG tablet Take 1 tablet (2.5 mg total) by mouth daily. 05/13/22   Pricilla Riffle, MD  amoxicillin (AMOXIL) 500 MG capsule Take four (4) capsules by mouth as needed one (1) hour prior to dental procedures. 02/05/15   [provider]  aspirin EC 81 MG tablet Take 81 mg by mouth daily.    [provider]  carvedilol (COREG) 6.25 MG tablet TAKE 1 TABLET BY MOUTH TWICE A DAY 07/15/22   Pricilla Riffle, MD  Cyanocobalamin (VITAMIN B-12 CR) 1500 MCG TBCR Take 1 tablet by mouth daily.    [provider]  gabapentin (NEURONTIN) 300 MG capsule Take 300 mg  by mouth 3 (three) times daily. 12/09/21   [provider]  GLUCOSAMINE-CHONDROITIN PO Take 2 tablets by mouth daily.    [provider]  LAGEVRIO 200 MG CAPS capsule SMARTSIG:4 Capsule(s) By Mouth Every 12 Hours 09/02/21   [provider]  loratadine (CLARITIN) 10 MG tablet Take 10 mg by mouth daily. During March and April    [provider]  olmesartan (BENICAR) 20 MG tablet TAKE 1 TABLET BY MOUTH EVERY DAY 09/12/22   Pricilla Riffle, MD  OZEMPIC, 0.25 OR 0.5 MG/DOSE, 2 MG/3ML SOPN Inject 0.5 mg into the skin once a week. 05/13/22   [provider]  pravastatin (PRAVACHOL) 40 MG tablet Take 40 mg by mouth daily. 10/10/21   [provider]  rosuvastatin (CRESTOR) 20 MG tablet TAKE 1 TABLET BY MOUTH EVERY DAY 07/21/22   Pricilla Riffle, MD   triamterene-hydrochlorothiazide [facility]) 37.5-25 MG tablet TAKE 1/2 TABLET BY MOUTH EVERY DAY 01/13/22   Pricilla Riffle, MD      Allergies    Atorvastatin and Other    Review of Systems   Review of Systems  Physical Exam Updated Vital Signs BP (!) 127/101 (BP Location: Right Arm)   Pulse 80   Temp 98 F (36.7 C) (Oral)   Resp 16   Ht 6\' 1"  (1.854 m)   Wt 109.8 kg   SpO2 98%   BMI 31.93 kg/m  Physical Exam Vitals and nursing note reviewed.  Constitutional:      General: He is not in acute distress.    Appearance: Normal appearance. He is well-developed. He is not ill-appearing or diaphoretic.  HENT:     Head: Normocephalic and atraumatic.  Eyes:     General: Visual field deficit (right eye upper visual field) present.     Extraocular Movements: Extraocular movements intact.     Conjunctiva/sclera: Conjunctivae normal.     Pupils: Pupils are equal, round, and reactive to light.  Cardiovascular:     Rate and Rhythm: Normal rate and regular rhythm.     Pulses: Normal pulses.     Heart sounds: Normal heart sounds. No murmur heard.    No friction rub. No gallop.  Pulmonary:     Effort: Pulmonary effort is normal. No respiratory distress.     Breath sounds: Normal breath sounds. No wheezing or rales.  Abdominal:     General: There is no distension.     Palpations: Abdomen is soft.     Tenderness: There is no abdominal tenderness. There is no guarding.  Musculoskeletal:        General: No swelling or tenderness.     Cervical back: Normal range of motion.  Skin:    General: Skin is warm and dry.     Findings: No erythema or rash.  Neurological:     General: No focal deficit present.     Mental Status: He is alert and oriented to person, place, and time.     GCS: GCS eye subscore is 4. GCS verbal subscore is 5. GCS motor subscore is 6.     Cranial Nerves: No cranial nerve deficit, dysarthria or facial asymmetry.     Sensory: No sensory deficit.     Motor: No  weakness or tremor.     Coordination: Coordination normal. Finger-Nose-Finger Test normal.     Gait: Gait normal.     ED Results / Procedures / Treatments   Labs (all labs ordered are listed, but only abnormal results are  displayed) Labs Reviewed  BASIC METABOLIC PANEL - Abnormal; Notable for the following components:      Result Value   Glucose, Bld 176 (*)    BUN 28 (*)    Creatinine, Ser 1.40 (*)    GFR, Estimated 50 (*)    All other components within normal limits  CBC - Abnormal; Notable for the following components:   RBC 3.91 (*)    Hemoglobin 12.5 (*)    HCT 37.6 (*)    All other components within normal limits  BRAIN NATRIURETIC PEPTIDE  TROPONIN I (HIGH SENSITIVITY)  TROPONIN I (HIGH SENSITIVITY)    EKG EKG Interpretation  Date/Time:  Sunday September 21 2022 08:21:36 EDT Ventricular Rate:  65 PR Interval:  258 QRS Duration: 101 QT Interval:  393 QTC Calculation: 409 R Axis:   64 Text Interpretation: Sinus rhythm Prolonged PR interval No significant change since last tracing Confirmed by Alvira Monday ([id number]) on 09/21/2022 8:56:05 AM  Radiology MR BRAIN WO CONTRAST  Result Date: 09/21/2022 CLINICAL DATA:  Numbness and visual field deficit EXAM: MRI HEAD WITHOUT CONTRAST TECHNIQUE: Multiplanar, multiecho pulse sequences of the brain and surrounding structures were obtained without intravenous contrast. COMPARISON:  09/21/2022 CT head, no prior MRI FINDINGS: Brain: No restricted diffusion to suggest acute or subacute infarct. No acute hemorrhage, mass, mass effect, or midline shift. No hydrocephalus or extra-axial collection. Normal pituitary and craniocervical junction. Punctate focus of hemosiderin deposition in the right cerebellum, likely sequela prior hypertensive microhemorrhage. No evidence of superficial siderosis. Cerebral volume is within normal limits for age. No disproportionate lobar atrophy. Scattered T2 hyperintense signal in the periventricular white  matter, likely the sequela of mild chronic small vessel ischemic disease. Vascular: Normal arterial flow voids. Skull and upper cervical spine: Normal marrow signal. Sinuses/Orbits: Complete opacification of the right frontal sinus. Mucosal thickening in the ethmoid air cells, greatest in the right anterior ethmoid. Mucous retention cyst in the left maxillary sinus. No acute finding in the orbits. Status post bilateral lens replacements. Other: Trace fluid in the mastoid air cells. IMPRESSION: No acute intracranial process. No evidence of acute or subacute infarct. Electronically Signed   By: Wiliam Ke M.D.   On: 09/21/2022 17:20   CT Angio Chest/Abd/Pel for Dissection W and/or Wo Contrast  Result Date: 09/21/2022 CLINICAL DATA:  Acute aortic syndrome. EXAM: CT ANGIOGRAPHY CHEST, ABDOMEN AND PELVIS TECHNIQUE: Non-contrast CT of the chest was initially obtained. Multidetector CT imaging through the chest, abdomen and pelvis was performed using the standard protocol during bolus administration of intravenous contrast. Multiplanar reconstructed images and MIPs were obtained and reviewed to evaluate the vascular anatomy. RADIATION DOSE REDUCTION: This exam was performed according to the departmental dose-optimization program which includes automated exposure control, adjustment of the mA and/or kV according to patient size and/or use of iterative reconstruction technique. CONTRAST:  OMNIPAQUE IOHEXOL 350 MG/ML SOLN COMPARISON:  CT urogram 09/10/2010 FINDINGS: CTA CHEST FINDINGS Cardiovascular: The heart size is normal. No substantial pericardial effusion. Coronary artery calcification is evident. Moderate atherosclerotic calcification is noted in the wall of the thoracic aorta. Pre contrast imaging shows no hyperdense crescent in the wall of the thoracic aorta to suggest the presence of an acute intramural hematoma. Ascending thoracic aorta measures 4.5 cm diameter. No dissection of the thoracic aorta.  Aortic arch branch vessel anatomy is widely patent. Pulmonary arteries are well opacified without evidence of an acute pulmonary embolus. Mediastinum/Nodes: No mediastinal lymphadenopathy. There is no hilar lymphadenopathy. The esophagus has normal  imaging features. There is no axillary lymphadenopathy. Lungs/Pleura: No suspicious pulmonary nodule or mass. No focal airspace consolidation. No pleural effusion. Musculoskeletal: No worrisome lytic or sclerotic osseous abnormality. Review of the MIP images confirms the above findings. CTA ABDOMEN AND PELVIS FINDINGS VASCULAR Aorta: Normal caliber aorta without aneurysm, dissection, vasculitis or significant stenosis. There is mild atherosclerotic calcification of the abdominal aorta without aneurysm. Celiac: Patent without evidence of aneurysm, dissection, vasculitis or significant stenosis. SMA: Patent without evidence of aneurysm, dissection, vasculitis or significant stenosis. Renals: Both renal arteries are patent without evidence of aneurysm, dissection, vasculitis, fibromuscular dysplasia or significant stenosis. IMA: Patent without evidence of aneurysm, dissection, vasculitis or significant stenosis. Inflow: Patent without evidence of aneurysm, dissection, vasculitis or significant stenosis. Veins: No obvious venous abnormality within the limitations of this arterial phase study. Review of the MIP images confirms the above findings. NON-VASCULAR Hepatobiliary: No suspicious focal abnormality within the liver parenchyma. There is no evidence for gallstones, gallbladder wall thickening, or pericholecystic fluid. No intrahepatic or extrahepatic biliary dilation. Pancreas: No focal mass lesion. No dilatation of the main duct. No intraparenchymal cyst. No peripancreatic edema. Spleen: No splenomegaly. No focal mass lesion. Adrenals/Urinary Tract: No adrenal nodule or mass. Small exophytic cyst upper pole right kidney without enhancement after IV contrast  administration. Tiny subcapsular calcific lesion posterior interpolar right kidney stable since 2015 consistent with benign etiology. No followup imaging is recommended. No suspicious abnormality in the left kidney. No hydroureteronephrosis. The urinary bladder appears normal for the degree of distention. Stomach/Bowel: Stomach is unremarkable. No gastric wall thickening. No evidence of outlet obstruction. Duodenum is normally positioned as is the ligament of Treitz. No small bowel wall thickening. No small bowel dilatation. The terminal ileum is normal. The appendix is normal. No gross colonic mass. No colonic wall thickening. Diverticular changes are noted in the left colon without evidence of diverticulitis. Lymphatic: There is no gastrohepatic or hepatoduodenal ligament lymphadenopathy. No retroperitoneal or mesenteric lymphadenopathy. No pelvic sidewall lymphadenopathy. Reproductive: Prostate gland is markedly enlarged. Other: No intraperitoneal free fluid. Musculoskeletal: No worrisome lytic or sclerotic osseous abnormality. Review of the MIP images confirms the above findings. IMPRESSION: 1. No evidence for acute intramural hematoma or dissection of the thoracoabdominal aorta. 2. Ascending thoracic aorta measures 4.5 cm diameter. Ascending thoracic aortic aneurysm. Recommend semi-annual imaging followup by CTA or MRA and referral to cardiothoracic surgery if not already obtained. This recommendation follows 2010 ACCF/AHA/AATS/ACR/ASA/SCA/SCAI/SIR/STS/SVM Guidelines for the Diagnosis and Management of Patients With Thoracic Aortic Disease. Circulation. 2010; 121: Z610-R604. Aortic aneurysm NOS (ICD10-I71.9) 3. Marked prostatomegaly. 4. Left colonic diverticulosis without diverticulitis. Electronically Signed   By: Kennith Center M.D.   On: 09/21/2022 10:38   CT Head Wo Contrast  Result Date: 09/21/2022 CLINICAL DATA:  Numbness and visual field deficit EXAM: CT HEAD WITHOUT CONTRAST TECHNIQUE: Contiguous  axial images were obtained from the base of the skull through the vertex without intravenous contrast. RADIATION DOSE REDUCTION: This exam was performed according to the departmental dose-optimization program which includes automated exposure control, adjustment of the mA and/or kV according to patient size and/or use of iterative reconstruction technique. COMPARISON:  None Available. FINDINGS: Brain: No evidence of acute infarction, hemorrhage, hydrocephalus, extra-axial collection or mass lesion/mass effect. Cerebral volume loss in keeping with age. Dilated perivascular space below the left putamen. Vascular: No hyperdense vessel or unexpected calcification. Skull: Normal. Negative for fracture or focal lesion. Sinuses/Orbits: No acute finding. IMPRESSION: Unremarkable head CT for age. Electronically Signed   By: Christiane Ha  Watts M.D.   On: 09/21/2022 10:30   DG Chest 2 View  Result Date: 09/21/2022 CLINICAL DATA:  Chest pain. Intermittent arrhythmia and left periscapular pain for 1 week. EXAM: CHEST - 2 VIEW COMPARISON:  Radiographs 03/23/2012. FINDINGS: The heart size and mediastinal contours are stable. The lungs are clear. There is no pleural effusion or pneumothorax. No acute osseous findings are identified. Mild thoracic spine degenerative changes. Telemetry leads overlie the chest. IMPRESSION: Stable chest. No active cardiopulmonary process. Electronically Signed   By: Carey Bullocks M.D.   On: 09/21/2022 09:13    Procedures Procedures    Medications Ordered in ED Medications  iohexol (OMNIPAQUE) 350 MG/ML injection 100 mL (100 mLs Intravenous Contrast Given 09/21/22 0940)    ED Course/ Medical Decision Making/ A&P                               82yo male with a history of htn, hlpd, obesity, who presents with concern for irregular heart rhythm, left side pain, left foot numbness.    DDx includes arrhythmia, dissection, ACS, PE, thrombosis, electrolyte abnormality, anemia, muscular  pain.  Regarding left sided pain: EKG completed and personally evaluated by me shows no acute abnormalities.    Labs completed and personally evaluated by me without clinically significant abnormalities, normal troponin x2 and doubt ACS.    CTA dissection study ordered given numbness, left side pain shows no sign of dissection. Does have aneurysm and will need follow up with CT surgery.  Suspect pain msk given worse with movements-recommend lidoderm patch, continued supportive care for this.   Recommend Cardiology follow up for palpitations.  Regarding left foot numbness:  has resolved, however noted to have right upper visual field defict.  CT head negative. Given foot numbness, right visual field deficit, will order MRI to evaluate for signs of CVA.  If MRI negative for stroke or other emergent abnormality, recommend outpatient follow up with PCP, ophthalmology, cardiology for palpitations.         Final Clinical Impression(s) / ED Diagnoses Final diagnoses:  Paresthesia of foot  Left-sided chest pain  Palpitations  Visual field defect of right eye    Rx / DC Orders ED Discharge Orders     None         Alvira Monday, MD 09/21/22 2151

## 2022-09-21 NOTE — Discharge Instructions (Signed)
Return if you start having persistent racing heart, trouble breathing, chest pain or passing out your brain scan today was normal.

## 2022-11-02 ENCOUNTER — Other Ambulatory Visit: Payer: Self-pay | Admitting: Internal Medicine

## 2023-02-01 ENCOUNTER — Other Ambulatory Visit: Payer: Self-pay | Admitting: Internal Medicine

## 2023-04-05 ENCOUNTER — Other Ambulatory Visit: Payer: Self-pay | Admitting: Internal Medicine

## 2023-05-31 ENCOUNTER — Other Ambulatory Visit: Payer: Self-pay | Admitting: Internal Medicine

## 2023-05-31 DIAGNOSIS — I10 Essential (primary) hypertension: Secondary | ICD-10-CM

## 2023-06-10 ENCOUNTER — Other Ambulatory Visit: Payer: Self-pay | Admitting: Internal Medicine

## 2023-06-10 DIAGNOSIS — I10 Essential (primary) hypertension: Secondary | ICD-10-CM

## 2023-06-14 ENCOUNTER — Other Ambulatory Visit: Payer: Self-pay | Admitting: Internal Medicine

## 2023-06-14 DIAGNOSIS — I10 Essential (primary) hypertension: Secondary | ICD-10-CM

## 2023-07-03 ENCOUNTER — Other Ambulatory Visit: Payer: Self-pay | Admitting: Internal Medicine

## 2023-07-03 DIAGNOSIS — I10 Essential (primary) hypertension: Secondary | ICD-10-CM

## 2023-07-11 ENCOUNTER — Other Ambulatory Visit: Payer: Self-pay | Admitting: Internal Medicine

## 2023-07-11 DIAGNOSIS — I10 Essential (primary) hypertension: Secondary | ICD-10-CM

## 2023-07-21 ENCOUNTER — Other Ambulatory Visit: Payer: Self-pay | Admitting: Internal Medicine

## 2023-08-18 ENCOUNTER — Other Ambulatory Visit: Payer: Self-pay | Admitting: Internal Medicine

## 2023-08-27 ENCOUNTER — Other Ambulatory Visit: Payer: Self-pay | Admitting: Internal Medicine

## 2023-10-03 ENCOUNTER — Other Ambulatory Visit: Payer: Self-pay | Admitting: Internal Medicine

## 2023-10-05 NOTE — Telephone Encounter (Signed)
 Left a message for the pt to call back to make him an OV appt.

## 2023-10-05 NOTE — Telephone Encounter (Signed)
 Pt's pharmacy is requesting a refill on pt's medication triamterene -hydrochlorothiazide 37.5-25 mg tablets. Pt has not been seen since 2023 and pt has had numerous attempts to make overdue appt. Would Dr. Avanell Bob like to refill medication without pt making an appt? Please address

## 2023-10-28 ENCOUNTER — Other Ambulatory Visit: Payer: Self-pay | Admitting: Internal Medicine
